# Patient Record
Sex: Female | Born: 1980 | Race: Black or African American | Hispanic: No | Marital: Married | Smoking: Never smoker
Health system: Southern US, Community
[De-identification: ages and names within clinical notes are randomized; demographics above are authoritative.]

## PROBLEM LIST (undated history)

## (undated) DIAGNOSIS — L309 Dermatitis, unspecified: Secondary | ICD-10-CM

## (undated) DIAGNOSIS — J45909 Unspecified asthma, uncomplicated: Secondary | ICD-10-CM

## (undated) HISTORY — DX: Dermatitis, unspecified: L30.9

## (undated) HISTORY — PX: NO PAST SURGERIES: SHX2092

## (undated) HISTORY — DX: Unspecified asthma, uncomplicated: J45.909

---

## 2002-07-29 ENCOUNTER — Emergency Department (HOSPITAL_COMMUNITY): Admission: EM | Admit: 2002-07-29 | Discharge: 2002-07-30 | Payer: Self-pay | Admitting: Unknown Physician Specialty

## 2002-08-01 ENCOUNTER — Emergency Department (HOSPITAL_COMMUNITY): Admission: EM | Admit: 2002-08-01 | Discharge: 2002-08-01 | Payer: Self-pay | Admitting: Emergency Medicine

## 2009-01-06 ENCOUNTER — Other Ambulatory Visit: Admission: RE | Admit: 2009-01-06 | Discharge: 2009-01-06 | Payer: Self-pay | Admitting: Family Medicine

## 2011-01-10 ENCOUNTER — Other Ambulatory Visit (HOSPITAL_COMMUNITY)
Admission: RE | Admit: 2011-01-10 | Discharge: 2011-01-10 | Disposition: A | Discharge status: Home / Self Care | Payer: BC Managed Care – PPO | Source: Ambulatory Visit | Attending: Family Medicine | Admitting: Family Medicine

## 2011-01-10 DIAGNOSIS — Z0000 Encounter for general adult medical examination without abnormal findings: Secondary | ICD-10-CM | POA: Insufficient documentation

## 2014-03-28 ENCOUNTER — Other Ambulatory Visit: Payer: Self-pay | Admitting: Obstetrics & Gynecology

## 2014-03-28 ENCOUNTER — Other Ambulatory Visit (HOSPITAL_COMMUNITY)
Admission: RE | Admit: 2014-03-28 | Discharge: 2014-03-28 | Disposition: A | Discharge status: Home / Self Care | Payer: BC Managed Care – PPO | Source: Ambulatory Visit | Attending: Obstetrics & Gynecology | Admitting: Obstetrics & Gynecology

## 2014-03-28 DIAGNOSIS — Z1151 Encounter for screening for human papillomavirus (HPV): Secondary | ICD-10-CM | POA: Insufficient documentation

## 2014-03-28 DIAGNOSIS — Z01419 Encounter for gynecological examination (general) (routine) without abnormal findings: Secondary | ICD-10-CM | POA: Insufficient documentation

## 2016-09-08 ENCOUNTER — Other Ambulatory Visit: Payer: Self-pay | Admitting: Obstetrics & Gynecology

## 2016-09-08 DIAGNOSIS — N63 Unspecified lump in unspecified breast: Secondary | ICD-10-CM

## 2016-09-13 ENCOUNTER — Ambulatory Visit
Admission: RE | Admit: 2016-09-13 | Discharge: 2016-09-13 | Disposition: A | Discharge status: Home / Self Care | Payer: BC Managed Care – PPO | Source: Ambulatory Visit | Attending: Obstetrics & Gynecology | Admitting: Obstetrics & Gynecology

## 2016-09-13 DIAGNOSIS — N63 Unspecified lump in unspecified breast: Secondary | ICD-10-CM

## 2017-05-15 ENCOUNTER — Other Ambulatory Visit: Payer: Self-pay | Admitting: Obstetrics & Gynecology

## 2017-05-15 DIAGNOSIS — N63 Unspecified lump in unspecified breast: Secondary | ICD-10-CM

## 2017-05-19 ENCOUNTER — Ambulatory Visit: Payer: BC Managed Care – PPO

## 2017-05-19 ENCOUNTER — Ambulatory Visit
Admission: RE | Admit: 2017-05-19 | Discharge: 2017-05-19 | Disposition: A | Discharge status: Home / Self Care | Payer: BC Managed Care – PPO | Source: Ambulatory Visit | Attending: Obstetrics & Gynecology | Admitting: Obstetrics & Gynecology

## 2017-05-19 DIAGNOSIS — N63 Unspecified lump in unspecified breast: Secondary | ICD-10-CM

## 2017-08-21 ENCOUNTER — Other Ambulatory Visit: Payer: Self-pay | Admitting: Obstetrics & Gynecology

## 2017-08-21 DIAGNOSIS — R921 Mammographic calcification found on diagnostic imaging of breast: Secondary | ICD-10-CM

## 2017-09-14 ENCOUNTER — Ambulatory Visit
Admission: RE | Admit: 2017-09-14 | Discharge: 2017-09-14 | Disposition: A | Discharge status: Home / Self Care | Payer: BC Managed Care – PPO | Source: Ambulatory Visit | Attending: Obstetrics & Gynecology | Admitting: Obstetrics & Gynecology

## 2017-09-14 DIAGNOSIS — R921 Mammographic calcification found on diagnostic imaging of breast: Secondary | ICD-10-CM

## 2017-10-23 ENCOUNTER — Ambulatory Visit: Payer: BC Managed Care – PPO | Admitting: Allergy & Immunology

## 2017-10-23 ENCOUNTER — Encounter: Payer: Self-pay | Admitting: Allergy & Immunology

## 2017-10-23 VITALS — BP 114/74 | HR 72 | Ht 64.75 in | Wt 155.4 lb

## 2017-10-23 DIAGNOSIS — J984 Other disorders of lung: Secondary | ICD-10-CM | POA: Diagnosis not present

## 2017-10-23 DIAGNOSIS — T781XXD Other adverse food reactions, not elsewhere classified, subsequent encounter: Secondary | ICD-10-CM | POA: Diagnosis not present

## 2017-10-23 DIAGNOSIS — R062 Wheezing: Secondary | ICD-10-CM | POA: Diagnosis not present

## 2017-10-23 DIAGNOSIS — R0602 Shortness of breath: Secondary | ICD-10-CM

## 2017-10-23 NOTE — Patient Instructions (Addendum)
1. Shortness of breath with wheezing - mildly responsive to bronchodilators - I am uncertain why you are having such low lung volumes, but we will try to see why you are having these issues. - We will start a prednisone taper to see if this can help reverse your symptoms. - Start a prednisone burst: 6 tabs daily x 2 days, 5 tabs daily x 2 days, 4 tabs daily x 2 days, 3 tabs daily x 2 days, 2 tabs daily x 2 days, 1 tab daily x 2 days, then stop. - We will also start you on a combined inhaled steroid and long-acting form of albuterol to see if this help with your lung testing as well: Breo 200/5 one puff once daily (sample provided). - I would like to get a chest X-ray as well to make sure we are not missing anything in your lungs.   2. Adverse food reaction - We will do more targeted testing at the next visit if your lung function is better. - List of available foods provided so that you can determine which foods you would like to test at the next visit. - Stay off of antihistamines for three days before the next appointment.   3. Return in about 2 weeks (around 11/06/2017).    Please inform us of any Emergency Department visits, hospitalizations, or changes in symptoms. Call us before going to the ED for breathing or allergy symptoms since we might be able to fit you in for a sick visit. Feel free to contact us anytime with any questions, problems, or concerns.  It was a pleasure to meet you today! Enjoy the holiday season!  Websites that have reliable patient information: 1. American Academy of Asthma, Allergy, and Immunology: www.aaaai.org 2. Food Allergy Research and Education (FARE): foodallergy.org 3. Mothers of Asthmatics: http://www.asthmacommunitynetwork.org 4. American College of Allergy, Asthma, and Immunology: www.acaai.org

## 2017-10-23 NOTE — Progress Notes (Signed)
NEW PATIENT  Date of Service/Encounter:  10/25/17  Referring provider: Janyth Pupa, DO   Assessment:   Shortness of breath - with evidence of restriction on spirometry  Adverse food reaction - multiple foods  Plan/Recommendations:   Cindy Hansen is a delightful 36yo female presenting with concerns for adverse food reactions. More concerning, however, is her rather terrible lung function today, which prevented [place] from being to do skin testing today. I am uncertain as to the etiology of her symptoms and spirometric findings. She has no findings of hypersensitivity pneumonitis or interstitial lung disease. Pulmonary fibrosis seems unlikely given her age and health history. She is also fairly asymptomatic. Air movement improved with each albuterol nebulizer treatment, which is reassuring. However, this did not change how she felt at all. She has no risk factors, including cigarette smoking or radiation exposure. She is not in a high risk job. I do think that she needs full pulmonary function testing and a high-resolution chest CT, however the patient prefers to hold off for now. This seems reasonable since I will be seeing her in follow up in two weeks.   1. Shortness of breath with wheezing - mildly responsive to bronchodilators - I am uncertain of the etiology of Cindy Hansen's symptoms.  - We will start a prednisone taper to see if this can help reverse your symptoms. - Start a prednisone burst: 6 tabs daily x 2 days, 5 tabs daily x 2 days, 4 tabs daily x 2 days, 3 tabs daily x 2 days, 2 tabs daily x 2 days, 1 tab daily x 2 days, then stop. - We will also start a combined inhaled steroid and long-acting form of albuterol to see if this help with your lung testing as well: Breo 200/5 one puff once daily (sample provided) - I would like to get a chest X-ray as well to make sure we are not missing anything in your lungs.  2. Adverse food reaction - We will do more targeted testing at the next  visit if your lung function is better. - List of available foods provided so that you can determine which foods you would like to test at the next visit. - Stay off of antihistamines for three days before the next appointment.   3. Return in about 2 weeks (around 11/06/2017).  Subjective:   Cindy Hansen is a 36 y.o. female presenting today for evaluation of  Chief Complaint  Patient presents with  . Allergy Testing    Pt presents to have allergy testing, Pt c/o wheezing and chest tightness, rash and sneezing at times. Soy and certain nuts cause rashes, dairy products cause vomitting    Cindy Hansen has a history of the following: Patient Active Problem List   Diagnosis Date Noted  . Restrictive lung disease 10/25/2017  . Adverse food reaction 10/25/2017    History obtained from: chart review and patient.  Cindy Hansen was referred by Janyth Pupa, DO.     Cindy Hansen is a 36 y.o. female presenting with concern of coughing wheezing with food exposures. Her history is rather difficult to follow, as she jumps from one topic to the next. From what I can gather,  Cindy Hansen does have a reaction to perfumes where she will start itching, throat scratching, ear scratching and eye burning. This results in immediate problems with breathing. She has learned to avoid certain environments to manage this. Symptoms resolve once she changes environments. She never needs any kind of medical intervention for these  symptoms, and she returns to baseline quickly thereafter. She does have a similar reaction to pet dander.   Cindy Hansen has noticed certain foods that result in reactions. She has noticed that soy containing foods cause breathing problems, indigestion, and shortness of breath. Symptoms occur within minutes of ingesting the food. She also a problem with trail mix at one point; five minutes after eating it, she had a similar constellation of symptoms including shortness and  indigestion once daily. She treated this with diphenhydramine with improvement in symptoms. She does tolerate peanuts without a problem; she eats these all of the time without a problem. She does have problems with pine nut-containing hummus. Chocolate is another one that causes these reactions. Cow's milk causes nearly immediate vomiting (liquid dairy occurs fairly quickly, but yogurt and cheese result in symptoms over a longer period of time). She does not have an EpiPen. She does not treat every reaction necessarily. She has considered going to see a gastroenterologist but has not seem one at this time.   Cindy Hansen does have a history of asthma and has albuterol to use as needed. She is very active and runs 4-5 times per week. She has never needed prednisone for her breathing and has never been on a daily asthma medication. She is a runner and can sustain long runs without breathing problems. She does have a history of itchy watery eyes, but this has actually improved over the last two years. She has never been allergy tested for environmental allergens.   Otherwise, there is no history of other atopic diseases, including drug allergies, stinging insect allergies, or urticaria. There is no significant infectious history. Vaccinations are up to date.    Past Medical History: Patient Active Problem List   Diagnosis Date Noted  . Restrictive lung disease 10/25/2017  . Adverse food reaction 10/25/2017    Medication List:  Allergies as of 10/23/2017   No Known Allergies     Medication List        Accurate as of 10/23/17 11:59 PM. Always use your most recent med list.          multivitamin tablet Take 1 tablet by mouth daily.       Birth History: non-contributory.   Developmental History: Cindy Hansen has met all milestones on time. She has required no speech therapy, occupational therapy, or physical therapy.   Past Surgical History: No past surgical history on file.   Family  History: No family history on file.   Social History: Cindy Hansen lives at home by herself. She lives in a house that is 36 years old. There is carpeting throughout the home. There is gas heating and central cooling. There are no animals inside or outside of the home. There are dust mite coverings in the bedding. There is no smoking exposure. She works as a Veterinary surgeon) . She did have two gerbils at some point, but otherwise no pets.     Review of Systems: a 14-point review of systems is pertinent for what is mentioned in HPI.  Otherwise, all other systems were negative. Constitutional: negative other than that listed in the HPI Eyes: negative other than that listed in the HPI Ears, nose, mouth, throat, and face: negative other than that listed in the HPI Respiratory: negative other than that listed in the HPI Cardiovascular: negative other than that listed in the HPI Gastrointestinal: negative other than that listed in the HPI Genitourinary: negative other than that listed in  the HPI Integument: negative other than that listed in the HPI Hematologic: negative other than that listed in the HPI Musculoskeletal: negative other than that listed in the HPI Neurological: negative other than that listed in the HPI Allergy/Immunologic: negative other than that listed in the HPI    Objective:   Blood pressure 114/74, pulse 72, height 5' 4.75" (1.645 m), weight 155 lb 6.4 oz (70.5 kg), SpO2 98 %. Body mass index is 26.06 kg/m.   Physical Exam:  General: Alert, interactive, in no acute distress. Pleasant but somewhat guarded.  Eyes: No conjunctival injection bilaterally, no discharge on the right, no discharge on the left and no Horner-Trantas dots present. PERRL bilaterally. EOMI without pain. No photophobia.  Ears: Right TM pearly gray with normal light reflex, Left TM pearly gray with normal light reflex, Right TM intact without perforation and Left TM  intact without perforation.  Nose/Throat: External nose within normal limits and septum midline. Turbinates edematous without discharge. Posterior oropharynx mildly erythematous without cobblestoning in the posterior oropharynx. Tonsils 2+ without exudates.  Tongue without thrush. Neck: Supple without thyromegaly. Trachea midline. Adenopathy: no enlarged lymph nodes appreciated in the anterior cervical, occipital, axillary, epitrochlear, inguinal, or popliteal regions. Lungs: Decreased breath sounds bilaterally without wheezing, rhonchi or rales. There are nearly absent sounds at the bases initially. No increased work of breathing. CV: Normal S1/S2. No murmurs. Capillary refill <2 seconds.  Abdomen: Nondistended, nontender. No guarding or rebound tenderness. Bowel sounds present in all fields and hypoactive  Skin: Warm and dry, without lesions or rashes. Extremities:  No clubbing, cyanosis or edema. Neuro:   Grossly intact. No focal deficits appreciated. Responsive to questions.  Diagnostic studies:   Spirometry: results abnormal (FEV1: 1.00/37%, FVC: 1.30/40%, FEV1/FVC: 77%).    Spirometry consistent with possible restrictive disease. Albuterol nebulizer treatment given in clinic with significant improvement in FEV1 and FVC per ATS criteria. We gave two treatments with improvement (see values below).  Date FVC FEV1 FEV1/FVC  10/23/2017 (1st) 1.3 1.00 0.77  10/23/2017 (2nd) 1.41 1.21 0.86  10/23/2017 (3rd) 1.49 1.27 0.85     Allergy Studies: none (deferred due to lung function)     Salvatore Marvel, MD [facility]

## 2017-10-25 ENCOUNTER — Encounter: Payer: Self-pay | Admitting: Allergy & Immunology

## 2017-10-25 DIAGNOSIS — J984 Other disorders of lung: Secondary | ICD-10-CM | POA: Insufficient documentation

## 2017-10-25 DIAGNOSIS — T781XXA Other adverse food reactions, not elsewhere classified, initial encounter: Secondary | ICD-10-CM | POA: Insufficient documentation

## 2017-11-06 ENCOUNTER — Ambulatory Visit: Payer: BC Managed Care – PPO | Admitting: Allergy & Immunology

## 2017-11-06 ENCOUNTER — Encounter: Payer: Self-pay | Admitting: Allergy & Immunology

## 2017-11-06 VITALS — BP 116/70 | HR 68 | Ht 64.0 in | Wt 155.0 lb

## 2017-11-06 DIAGNOSIS — T781XXD Other adverse food reactions, not elsewhere classified, subsequent encounter: Secondary | ICD-10-CM | POA: Diagnosis not present

## 2017-11-06 DIAGNOSIS — J4540 Moderate persistent asthma, uncomplicated: Secondary | ICD-10-CM

## 2017-11-06 DIAGNOSIS — J3089 Other allergic rhinitis: Secondary | ICD-10-CM

## 2017-11-06 MED ORDER — CETIRIZINE HCL 10 MG PO TABS: 10.0000 mg | ORAL_TABLET | Freq: Every day | ORAL | 5 refills | Status: DC

## 2017-11-06 MED ORDER — FLUTICASONE FUROATE-VILANTEROL 200-25 MCG/INH IN AEPB: 1.0000 | INHALATION_SPRAY | Freq: Every day | RESPIRATORY_TRACT | 3 refills | Status: DC

## 2017-11-06 MED ORDER — ALBUTEROL SULFATE HFA 108 (90 BASE) MCG/ACT IN AERS: 2.0000 | INHALATION_SPRAY | RESPIRATORY_TRACT | 1 refills | Status: DC | PRN

## 2017-11-06 NOTE — Progress Notes (Signed)
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 Moderate persistent asthma - improved spirometry with prednisone burst - Your lung function completely normalized today, confirming the diagnosis of asthma. - We will continue Breo for now, but we may decrease this dose over time.  - Daily controller medication(s): Breo 200/3225mcg one puff once daily - Prior to physical activity: ProAir 2 puffs 10-15 minutes before physical activity. - Rescue medications: ProAir 4 puffs every 4-6 hours as needed - Asthma control goals:  * Full participation in all desired activities (may need albuterol before activity) * Albuterol use two time or less a week on average (not counting use with activity) * Cough interfering with sleep two time or less a month * Oral steroids no more than once a year * No hospitalizations  2. Adverse food reaction - milk - Testing was positive only to milk today, so I would definitely avoid that. - Your other foods are likely intolerances only and not life-threatening. - Testing was negative to peanut, tree nuts, soy, wheat, egg, shellfish mix, fish mix, barley, oat, rye, and sesame as well as chocolate. - There is a the low positive predictive value of food allergy testing and hence the high possibility of false positives. - In contrast, food allergy testing has a high negative predictive value, therefore if testing is negative we can be relatively assured that they are indeed negative.  - We will send in a script for AuviQ (epinephrine). - This is a similar medication as an EpiPen, but a different delivery device.  - They will call you in 1-2 days to confirm your shipping address.    3. Perennial allergic rhinitis - Testing today showed: horse, mouse,  dust mites, cat and dog - Avoidance measures provided. - Start taking: Zyrtec (cetirizine) 10mg  tablet once daily as needed.  - You can use an extra dose of the antihistamine, if needed, for breakthrough symptoms.  - Consider nasal saline rinses 1-2 times daily to remove allergens from the nasal cavities as well as help with mucous clearance as needed.   4. Return in about 3 months (around 02/04/2018).  Subjective:   Cindy Hansen is a 36 y.o. female presenting today for follow up of  Chief Complaint  Patient presents with  . Follow-up    Pt presents for follow up of asthma, Pt started recommended medications and did notice some improvement.    Cindy Hansen has a history of the following: Patient Active Problem List   Diagnosis Date Noted  . Restrictive lung disease 10/25/2017  . Adverse food reaction 10/25/2017    History obtained from: chart review and patient.  Cindy Hansen's Primary Care Provider is Myna Hidalgozan, Jennifer, DO.     Cindy NortonSharonda is a 36 y.o. female presenting for a follow up visit. She was last seen around two weeks ago. At that time, she was very concerned with multiple adverse food reactions versus intolerances. However, since she had a history of requiring albuterol, we did perform a spirometry which looked rather terrible. It was reversible but did not improve completely with the albuterol treatment. Therefore, we put her on a nearly 14 day course of prednisone to see if we could reverse this. We discuss doing full pulmonary function testing as well as a chest X-ray, but she declined  this workup. At also started him on Breo 200/25 one puff once daily. Her reactions consisted of shortness of breath with vague GI symptoms, but her SOB was occurring throughout the entire day anyway even in the absence of food triggers.   Since the last visit, she has done well. She does report some problems sleeping with the prolonged prednisone taper, but overall she feels that  this has helped her symptoms tremendously. [facility] and reports excellent compliance with this. She did recently complete a trip to [place] over the holiday and did well during this trip.   She continues to be interested in food allergy testing and would like to proceed if her lung testing is improved. Otherwise, there have been no changes to her past medical history, surgical history, family history, or social history.    Review of Systems: a 14-point review of systems is pertinent for what is mentioned in HPI.  Otherwise, all other systems were negative. Constitutional: negative other than that listed in the HPI Eyes: negative other than that listed in the HPI Ears, nose, mouth, throat, and face: negative other than that listed in the HPI Respiratory: negative other than that listed in the HPI Cardiovascular: negative other than that listed in the HPI Gastrointestinal: negative other than that listed in the HPI Genitourinary: negative other than that listed in the HPI Integument: negative other than that listed in the HPI Hematologic: negative other than that listed in the HPI Musculoskeletal: negative other than that listed in the HPI Neurological: negative other than that listed in the HPI Allergy/Immunologic: negative other than that listed in the HPI    Objective:   Blood pressure 116/70, pulse 68, height 5\' 4"  (1.626 m), weight 155 lb (70.3 kg), SpO2 99 %. Body mass index is 26.61 kg/m.   Physical Exam: deferred since this was a skin testing appointment only    Diagnostic studies: none  Spirometry: results normal (FEV1: 2.19/83%, FVC: 3.28/104%, FEV1/FVC: 67%).    Spirometry consistent with normal pattern.   Allergy Studies:   Indoor/Outdoor Percutaneous Adult Environmental Panel: positive to [place], Df mite, cat, dog, horse and mouse. Otherwise negative with adequate controls.  Selected Food Panel: positive to Milk (2 x 5) with adequate controls.  Negative to Peanut, Soy, Wheat, Egg, Casein, Shellfish Mix, Fish Mix, Cashew, [place], [address], Chocolate, [place], [place], [place] nut, [place], Oat, Rye and Sesame      Malachi BondsJoel Koryn Charlot, MD FAAAAI [facility]

## 2017-11-06 NOTE — Patient Instructions (Addendum)
1. Moderate persistent asthma - improved spirometry with prednisone burst - Your lung function completely normalized today, confirming the diagnosis of asthma. - We will continue Breo for now, but we may decrease this dose over time.  - Daily controller medication(s): Breo 200/425mcg one puff once daily - Prior to physical activity: ProAir 2 puffs 10-15 minutes before physical activity. - Rescue medications: ProAir 4 puffs every 4-6 hours as needed - Asthma control goals:  * Full participation in all desired activities (may need albuterol before activity) * Albuterol use two time or less a week on average (not counting use with activity) * Cough interfering with sleep two time or less a month * Oral steroids no more than once a year * No hospitalizations  2. Adverse food reaction - milk - Testing was positive only to milk today, so I would definitely avoid that. - Your other foods are likely intolerances only and not life-threatening. - Testing was negative to peanut, tree nuts, soy, wheat, egg, shellfish mix, fish mix, barley, oat, rye, and sesame as well as chocolate. - There is a the low positive predictive value of food allergy testing and hence the high possibility of false positives. - In contrast, food allergy testing has a high negative predictive value, therefore if testing is negative we can be relatively assured that they are indeed negative.  - We will send in a script for AuviQ (epinephrine). - This is a similar medication as an EpiPen, but a different delivery device.  - They will call you in 1-2 days to confirm your shipping address.    3. Perennial allergic rhinitis - Testing today showed: horse, mouse, dust mites, cat and dog - Avoidance measures provided. - Start taking: Zyrtec (cetirizine) 10mg  tablet once daily as needed.  - You can use an extra dose of the antihistamine, if needed, for breakthrough symptoms.  - Consider nasal saline rinses 1-2 times daily to remove  allergens from the nasal cavities as well as help with mucous clearance as needed.   4. Return in about 3 months (around 02/04/2018).    Please inform us of any Emergency Department visits, hospitalizations, or changes in symptoms. Call us before going to the ED for breathing or allergy symptoms since we might be able to fit you in for a sick visit. Feel free to contact us anytime with any questions, problems, or concerns.  It was a pleasure to meet you today! Enjoy the holiday season!  Websites that have reliable patient information: 1. American Academy of Asthma, Allergy, and Immunology: www.aaaai.org 2. Food Allergy Research and Education (FARE): foodallergy.org 3. Mothers of Asthmatics: http://www.asthmacommunitynetwork.org 4. American College of Allergy, Asthma, and Immunology: www.acaai.org     Control of House Dust Mite Allergen    House dust mites play a major role in allergic asthma and rhinitis.  They occur in environments with high humidity wherever human skin, the food for dust mites is found. High levels have been detected in dust obtained from mattresses, pillows, carpets, upholstered furniture, bed covers, clothes and soft toys.  The principal allergen of the house dust mite is found in its feces.  A gram of dust may contain 1,000 mites and 250,000 fecal particles.  Mite antigen is easily measured in the air during house cleaning activities.    1. Encase mattresses, including the box spring, and pillow, in an air tight cover.  Seal the zipper end of the encased mattresses with wide adhesive tape. 2. Wash the bedding in water of  130 degrees Farenheit weekly.  Avoid cotton comforters/quilts and flannel bedding: the most ideal bed covering is the dacron comforter. 3. Remove all upholstered furniture from the bedroom. 4. Remove carpets, carpet padding, rugs, and non-washable window drapes from the bedroom.  Wash drapes weekly or use plastic window coverings. 5. Remove all  non-washable stuffed toys from the bedroom.  Wash stuffed toys weekly. 6. Have the room cleaned frequently with a vacuum cleaner and a damp dust-mop.  The patient should not be in a room which is being cleaned and should wait 1 hour after cleaning before going into the room. 7. Close and seal all heating outlets in the bedroom.  Otherwise, the room will become filled with dust-laden air.  An electric heater can be used to heat the room. 8. Reduce indoor humidity to less than 50%.  Do not use a humidifier.  Control of Dog or Cat Allergen  Avoidance is the best way to manage a dog or cat allergy. If you have a dog or cat and are allergic to dog or cats, consider removing the dog or cat from the home. If you have a dog or cat but don't want to find it a new home, or if your family wants a pet even though someone in the household is allergic, here are some strategies that may help keep symptoms at bay:  1. Keep the pet out of your bedroom and restrict it to only a few rooms. Be advised that keeping the dog or cat in only one room will not limit the allergens to that room. 2. Don't pet, hug or kiss the dog or cat; if you do, wash your hands with soap and water. 3. High-efficiency particulate air (HEPA) cleaners run continuously in a bedroom or living room can reduce allergen levels over time. 4. Regular use of a high-efficiency vacuum cleaner or a central vacuum can reduce allergen levels. 5. Giving your dog or cat a bath at least once a week can reduce airborne allergen.

## 2017-11-20 ENCOUNTER — Other Ambulatory Visit: Payer: Self-pay

## 2017-11-20 MED ORDER — EPINEPHRINE 0.3 MG/0.3ML IJ SOAJ: 0.3000 mg | Freq: Once | INTRAMUSCULAR | 1 refills | Status: AC

## 2018-02-06 ENCOUNTER — Other Ambulatory Visit: Payer: Self-pay | Admitting: Allergy & Immunology

## 2018-02-06 NOTE — Telephone Encounter (Signed)
Courtesy refill  

## 2018-02-15 ENCOUNTER — Encounter: Payer: Self-pay | Admitting: Allergy & Immunology

## 2018-02-15 ENCOUNTER — Ambulatory Visit: Payer: BC Managed Care – PPO | Admitting: Allergy & Immunology

## 2018-02-15 VITALS — BP 100/70 | HR 60 | Resp 16

## 2018-02-15 DIAGNOSIS — T7800XD Anaphylactic reaction due to unspecified food, subsequent encounter: Secondary | ICD-10-CM

## 2018-02-15 DIAGNOSIS — J4540 Moderate persistent asthma, uncomplicated: Secondary | ICD-10-CM

## 2018-02-15 DIAGNOSIS — J3089 Other allergic rhinitis: Secondary | ICD-10-CM

## 2018-02-15 NOTE — Patient Instructions (Addendum)
1. Moderate persistent asthma  - Lung function was mostly normal today. - Try taking your Breo at night to see if that helps prevent that throat/chest discomfort.  - Give us a call next week to give us an update. - We could change to Symbicort in the future if needed.  - Daily controller medication(s): Breo 200/7525mcg one puff once daily - Prior to physical activity: ProAir 2 puffs 10-15 minutes before physical activity. - Rescue medications: ProAir 4 puffs every 4-6 hours as needed - Asthma control goals:  * Full participation in all desired activities (may need albuterol before activity) * Albuterol use two time or less a week on average (not counting use with activity) * Cough interfering with sleep two time or less a month * Oral steroids no more than once a year * No hospitalizations  2. Adverse food reaction (cow's milk)  - Continue to avoid cow's milk.  Audry Riles- AuviQ is up to date.   3. Perennial allergic rhinitis (horse, mouse, dust mites, cat and dog) - Continue with Zyrtec (cetirizine) 10mg  tablet once daily as needed.  - You can use an extra dose of the antihistamine, if needed, for breakthrough symptoms.   4. Return in about 6 months (around 08/17/2018).  Please inform us of any Emergency Department visits, hospitalizations, or changes in symptoms. Call us before going to the ED for breathing or allergy symptoms since we might be able to fit you in for a sick visit. Feel free to contact us anytime with any questions, problems, or concerns.  It was a pleasure to see you again today!  Websites that have reliable patient information: 1. American Academy of Asthma, Allergy, and Immunology: www.aaaai.org 2. Food Allergy Research and Education (FARE): foodallergy.org 3. Mothers of Asthmatics: http://www.asthmacommunitynetwork.org 4. American College of Allergy, Asthma, and Immunology: www.acaai.org

## 2018-02-15 NOTE — Progress Notes (Signed)
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  seems to have done well since her last visit.  Her Breo improved both her cough and her exercise tolerance.  She also is using markedly less albuterol. However, she is having some chest tightness and throat irritation, which she does attribute to her controller medication.  He does premedicate with albuterol prior to physical activity.  Hoarseness can be a common side effect of these dry powder inhalers, therefore this might be what is manifesting during her episodes of physical activity. We will try to change the timing of her Breo administration to see if this can improve her symptoms. However, if she continues to have problems we will try changing to Symbicort which unfortunately has to be given more frequently and/or adding on montelukast. Environmental allergies and food allergies are under good control. There is no indication for allergen immunotherapy at this time.   1. Moderate persistent asthma  - Lung function was mostly normal today. - Try taking your Breo at night to see if that helps prevent that throat/chest discomfort.  - Give [place] a call next week to give [place] an update. - We could change to Symbicort in the future if needed.  - Daily controller medication(s): Breo 200/56mcg one puff once daily - Prior to physical activity: ProAir 2 puffs 10-15 minutes before physical activity. - Rescue medications: ProAir 4 puffs every 4-6 hours as needed - Asthma control goals:  * Full participation in all desired activities (may need albuterol before activity) * Albuterol use two time or less a week on average (not  counting use with activity) * Cough interfering with sleep two time or less a month * Oral steroids no more than once a year * No hospitalizations  2. Adverse food reaction (cow's milk)  - Continue to avoid cow's milk.  Cindy Hansen is up to date.   3. Perennial allergic rhinitis (horse, mouse, dust mites, cat and dog) - Continue with Zyrtec (cetirizine) 10mg  tablet once daily as needed.  - You can use an extra dose of the antihistamine, if needed, for breakthrough symptoms.   4. Return in about 6 months (around 08/17/2018).  Subjective:   Cindy Hansen is a 37 y.o. female presenting today for follow up of  Chief Complaint  Patient presents with  . Asthma    doing good  . Allergies    doing good    Cindy Hansen has a history of the following: Patient Active Problem List   Diagnosis Date Noted  . Restrictive lung disease 10/25/2017  . Adverse food reaction 10/25/2017    History obtained from: chart review and patient.  Cindy Hansen's Primary Care Provider is Cindy Hidalgo, DO.     Cindy Hansen is a 37 y.o. female presenting for a follow up visit. She was last seen in December 2018.  At that time, her lung function had completely normalized on Breo 200/25 1 puff once daily.  She did have testing that was positive to milk, but otherwise negative to the most common food allergens.  I recommended avoidance of cow's milk and all forms.  She also had environmental allergy testing that was positive to horse, mouse,  dust mites, cat, and dog.  We started her on cetirizine 10 mg daily.  Since the last visit, she has mostly done well.  Asthma/Respiratory Symptom History: She remains on her Cindy Hansen but she feels that it is causing some burning when she uses it. 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 During the run, she begins to have some burning in her chest and throat. She denies throat closure but it is more of a "really  strong burning". She has been using albuterol prior to her run and then did Breo in the evening. Prior to the onset of this, she was taking her Cindy Hansen prior to going to work. She will run at 6pm at night so it has had time to "work into her system". She has not had her Breo today.   Allergic Rhinitis Symptom History: She remains on her cetirizine daily. This seems to control her allergic rhinitis without a problem. She has no intervening sinus infections. She does not have pets in her home at all. 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 But with the cetirizine on board, she is better able to tolerate exposure to these students.   Food Allergy Symptom History: She remains on almond milk as her substitute. She avoids milk in all forms. She can eat small amounts of baked goods without a problem. She was able to finally get the AuviQ. Evidently it was noted her in chart that she had Medicaid, which is what held up her prescription.   Otherwise, there have been no changes to her past medical history, surgical history, family history, or social history.    Review of Systems: a 14-point review of systems is pertinent for what is mentioned in HPI.  Otherwise, all other systems were negative. Constitutional: negative other than that listed in the HPI Eyes: negative other than that listed in the HPI Ears, nose, mouth, throat, and face: negative other than that listed in the HPI Respiratory: negative other than that listed in the HPI Cardiovascular: negative other than that listed in the HPI Gastrointestinal: negative other than that listed in the HPI Genitourinary: negative other than that listed in the HPI Integument: negative other than that listed in the HPI Hematologic: negative other than that listed in the HPI Musculoskeletal: negative other than that listed in the HPI Neurological: negative other than that listed in the HPI Allergy/Immunologic: negative other than that listed  in the HPI    Objective:   Blood pressure 100/70, pulse 60, resp. rate 16. There is no height or weight on file to calculate BMI.   Physical Exam:  General: Alert, interactive, in no acute distress. Pleasant female. Well dressed.  Eyes: No conjunctival injection bilaterally, no discharge on the right, no discharge on the left and no Horner-Trantas dots present. PERRL bilaterally. EOMI without pain. No photophobia.  Ears: Right TM pearly gray with normal light reflex, Left TM pearly gray with normal light reflex, Right TM intact without perforation and Left TM intact without perforation.  Nose/Throat: External nose within normal limits and septum midline. Turbinates edematous without discharge. Posterior oropharynx mildly erythematous without cobblestoning in the posterior oropharynx. Tonsils 2+ without exudates.  Tongue without thrush. Lungs: Clear to auscultation without wheezing, rhonchi or rales. No increased work of breathing. CV: Normal S1/S2. No murmurs. Capillary refill <2 seconds.  Skin: Warm and dry, without lesions or rashes. Neuro:   Grossly intact. No focal deficits appreciated.  Responsive to questions.  Diagnostic studies:   Spirometry: results abnormal (FEV1: 1.79/65%, FVC: 2.55/77%, FEV1/FVC: 70%).    Spirometry consistent with possible restrictive disease.   Allergy Studies: none      Malachi BondsJoel Marlean Mortell, MD [facility]

## 2018-02-28 ENCOUNTER — Other Ambulatory Visit: Payer: Self-pay | Admitting: Allergy & Immunology

## 2018-03-04 ENCOUNTER — Other Ambulatory Visit: Payer: Self-pay | Admitting: Allergy & Immunology

## 2018-03-05 ENCOUNTER — Other Ambulatory Visit: Payer: Self-pay | Admitting: Allergy & Immunology

## 2018-07-06 ENCOUNTER — Other Ambulatory Visit: Payer: Self-pay | Admitting: Allergy & Immunology

## 2018-08-12 ENCOUNTER — Other Ambulatory Visit: Payer: Self-pay | Admitting: Allergy & Immunology

## 2018-08-15 ENCOUNTER — Other Ambulatory Visit: Payer: Self-pay | Admitting: Obstetrics & Gynecology

## 2018-08-15 DIAGNOSIS — N6489 Other specified disorders of breast: Secondary | ICD-10-CM

## 2018-09-05 ENCOUNTER — Other Ambulatory Visit: Payer: Self-pay | Admitting: Allergy & Immunology

## 2018-09-17 ENCOUNTER — Ambulatory Visit
Admission: RE | Admit: 2018-09-17 | Discharge: 2018-09-17 | Disposition: A | Discharge status: Home / Self Care | Payer: BC Managed Care – PPO | Source: Ambulatory Visit | Attending: Obstetrics & Gynecology | Admitting: Obstetrics & Gynecology

## 2018-09-17 DIAGNOSIS — N6489 Other specified disorders of breast: Secondary | ICD-10-CM

## 2018-09-29 ENCOUNTER — Other Ambulatory Visit: Payer: Self-pay | Admitting: Allergy & Immunology

## 2018-10-01 ENCOUNTER — Other Ambulatory Visit: Payer: Self-pay | Admitting: Allergy & Immunology

## 2018-10-01 NOTE — Telephone Encounter (Signed)
Courtesy refill  

## 2018-10-25 ENCOUNTER — Other Ambulatory Visit: Payer: Self-pay | Admitting: Allergy & Immunology

## 2018-11-16 ENCOUNTER — Other Ambulatory Visit: Payer: Self-pay | Admitting: Allergy & Immunology

## 2018-11-16 MED ORDER — FLUTICASONE FUROATE-VILANTEROL 200-25 MCG/INH IN AEPB: 1.0000 | INHALATION_SPRAY | Freq: Every day | RESPIRATORY_TRACT | 0 refills | Status: DC

## 2018-11-16 MED ORDER — ALBUTEROL SULFATE HFA 108 (90 BASE) MCG/ACT IN AERS: 2.0000 | INHALATION_SPRAY | RESPIRATORY_TRACT | 1 refills | Status: DC | PRN

## 2018-11-16 NOTE — Telephone Encounter (Signed)
Script sent into pharmacy for 30 day supply only. Patient must keep appointment for additional refills.

## 2018-11-16 NOTE — Telephone Encounter (Signed)
Patient needs a refill on medications sent to [facility] on [address] Patient has upcoming appt 01/28 - was last seen 02/2018 The pharmacy has her script from 10/2017 and wont refill anymore  Can a courtesy refill be sent in since it has not been a year since the patient was last seen??

## 2018-12-04 ENCOUNTER — Ambulatory Visit: Payer: BC Managed Care – PPO | Admitting: Allergy & Immunology

## 2018-12-16 ENCOUNTER — Other Ambulatory Visit: Payer: Self-pay | Admitting: Allergy & Immunology

## 2018-12-25 ENCOUNTER — Ambulatory Visit: Payer: BC Managed Care – PPO | Admitting: Allergy & Immunology

## 2018-12-25 ENCOUNTER — Encounter: Payer: Self-pay | Admitting: Allergy & Immunology

## 2018-12-25 VITALS — BP 110/68 | HR 65 | Resp 16 | Ht 65.0 in | Wt 195.0 lb

## 2018-12-25 DIAGNOSIS — T7800XD Anaphylactic reaction due to unspecified food, subsequent encounter: Secondary | ICD-10-CM

## 2018-12-25 DIAGNOSIS — J3089 Other allergic rhinitis: Secondary | ICD-10-CM | POA: Diagnosis not present

## 2018-12-25 DIAGNOSIS — J4540 Moderate persistent asthma, uncomplicated: Secondary | ICD-10-CM

## 2018-12-25 MED ORDER — MONTELUKAST SODIUM 10 MG PO TABS: 10.0000 mg | ORAL_TABLET | Freq: Every day | ORAL | 6 refills | Status: DC

## 2018-12-25 MED ORDER — FLUTICASONE-UMECLIDIN-VILANT 100-62.5-25 MCG/INH IN AEPB: 1.0000 | INHALATION_SPRAY | Freq: Every day | RESPIRATORY_TRACT | 6 refills | Status: AC

## 2018-12-25 NOTE — Patient Instructions (Addendum)
1. Moderate persistent asthma  - Lung function was fairly normal today. - It seems that the Virgel Bouquet is working well, but I am going to give you a sample of Trelegy instead (this contains Breo as well as another medication that can help keeps your lungs open).  - We are also adding on a pill to take at night to help with your asthma.  - Daily controller medication(s): Singulair 10mg  daily and Trelegy 100/62.5/25 one puff once daily - Prior to physical activity: ProAir 2 puffs 10-15 minutes before physical activity. - Rescue medications: ProAir 4 puffs every 4-6 hours as needed - Asthma control goals:  * Full participation in all desired activities (may need albuterol before activity) * Albuterol use two time or less a week on average (not counting use with activity) * Cough interfering with sleep two time or less a month * Oral steroids no more than once a year * No hospitalizations  2. Adverse food reaction (cow's milk)  - Continue to avoid cow's milk.  Audry Riles is up to date.   3. Perennial allergic rhinitis (horse, mouse, dust mites, cat and dog) - Continue with Zyrtec (cetirizine) 10mg  tablet once daily as needed.  - You can use an extra dose of the antihistamine, if needed, for breakthrough symptoms. - If you get a dog, keep him out of the bedroom. - Hardwood floors are better for controlling animal dander.   4. Return in about 3 months (around 03/25/2019).   Please inform us of any Emergency Department visits, hospitalizations, or changes in symptoms. Call us before going to the ED for breathing or allergy symptoms since we might be able to fit you in for a sick visit. Feel free to contact us anytime with any questions, problems, or concerns.  It was a pleasure to see you again today!  Websites that have reliable patient information: 1. American Academy of Asthma, Allergy, and Immunology: www.aaaai.org 2. Food Allergy Research and Education (FARE): foodallergy.org 3. Mothers of  Asthmatics: http://www.asthmacommunitynetwork.org 4. American College of Allergy, Asthma, and Immunology: MissingWeapons.ca   Make sure you are registered to vote! If you have moved or changed any of your contact information, you will need to get this updated before voting!    Voter ID laws are POSSIBLY going into effect for the General Election in November 2020! Be prepared! Check out [employer]dk for more details.

## 2018-12-25 NOTE — Progress Notes (Signed)
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 Moderate persistent asthma  - Lung function was fairly normal today. - It seems that the Virgel Bouquet is working well, but I am going to give you a sample of Trelegy instead (this contains Breo as well as another medication that can help keeps your lungs open).  - We are also adding on a pill to take at night to help with your asthma.  - Daily controller medication(s): Singulair 10mg  daily and Trelegy 100/62.5/25 one puff once daily - Prior to physical activity: ProAir 2 puffs 10-15 minutes before physical activity. - Rescue medications: ProAir 4 puffs every 4-6 hours as needed - Asthma control goals:  * Full participation in all desired activities (may need albuterol before activity) * Albuterol use two time or less a week on average (not counting use with activity) * Cough interfering with sleep two time or less a month * Oral steroids no more than once a year * No hospitalizations  2. Adverse food reaction (cow's milk)  - Continue to avoid cow's milk.  Audry Riles is up to date.   3. Perennial allergic rhinitis (horse, mouse, dust mites, cat and dog) - Continue with Zyrtec (cetirizine) 10mg  tablet once daily as needed.  - You can use an extra dose of the antihistamine, if needed, for breakthrough symptoms. - If you get a dog, keep him out of the bedroom. - Hardwood floors are better for controlling animal dander.   4. Return in about 3 months (around 03/25/2019).  Subjective:   Cindy Hansen is a 38 y.o. female presenting today for follow up of  Chief Complaint  Patient presents with  . Asthma    Cindy Hansen has a history of the following: Patient Active Problem List   Diagnosis Date Noted  . Restrictive lung disease 10/25/2017  . Adverse food reaction 10/25/2017    History obtained from: chart review and patient.  Cindy Hansen is a 38 y.o. female presenting for a follow up visit.  She was last seen in April 2019.  At that time, her lung testing was normal.  I recommended that she take her Breo at night to see if that helped with the throat and chest discomfort.  For her allergic rhinitis, we continued Zyrtec.  We recommended continued avoidance of cows milk.  Asthma/Respiratory Symptom History: She remains on the Breo one puff once daily. She can tell when she is having problems because she will have gaps when she does not have the medication. This leads to problems with increased symptoms of her asthma. She has not been on it consistently aside from one week. She has not needed prednisone at all since that last visit. She does need her rescue inhaler fairly rarely. Lately it has been more frequent since she has not had her Breo, but maybe 1-2 times per week. She has not missed work for her asthma and has not changed her life for her asthma. She does note that she is unable to run. She does even premedicate prior to albuterol.  Allergic Rhinitis Symptom History: She is using her cetirizine dialy but she is not using a nose spray. Typically her issues  are more related to sneezing/itchy watery eyes. She does have a flare with floral scents. She also notes that she has had a terrible sinus infection. She reports that she had nasal pressure and a sinus headache with mucous production. She ended up using a decongestant to help with this. She is better now. Cat is a big trigger for her. Her last testing was done in December 2018 at which time she had testing that was positive to cat, dog, dust mite, horse, and mouse.    Otherwise, there is no history of other atopic diseases, including food  allergies, drug allergies, stinging insect allergies, eczema, urticaria or contact dermatitis. There is no significant infectious history. Vaccinations are up to date.   Otherwise, there have been no changes to her past medical history, surgical history, family history, or social history.    Review of Systems  Constitutional: Negative.  Negative for fever, malaise/fatigue and weight loss.  HENT: Negative.  Negative for congestion, ear discharge and ear pain.   Eyes: Negative for pain, discharge and redness.  Respiratory: Positive for cough and shortness of breath. Negative for sputum production and wheezing.   Cardiovascular: Negative.  Negative for chest pain and palpitations.  Gastrointestinal: Negative for abdominal pain and heartburn.  Skin: Negative.  Negative for itching and rash.  Neurological: Negative for dizziness and headaches.  Endo/Heme/Allergies: Negative for environmental allergies. Does not bruise/bleed easily.       Objective:   Blood pressure 110/68, pulse 65, resp. rate 16, height 5\' 5"  (1.651 m), weight 195 lb (88.5 kg), SpO2 98 %. Body mass index is 32.45 kg/m.   Physical Exam:  Physical Exam  Constitutional: She appears well-developed.  HENT:  Head: Normocephalic and atraumatic.  Right Ear: Tympanic membrane, external ear and ear canal normal.  Left Ear: Tympanic membrane and ear canal normal.  Nose: No mucosal edema, rhinorrhea, nasal deformity or septal deviation. No epistaxis. Right sinus exhibits no maxillary sinus tenderness and no frontal sinus tenderness. Left sinus exhibits no maxillary sinus tenderness and no frontal sinus tenderness.  Mouth/Throat: Uvula is midline and oropharynx is clear and moist. Mucous membranes are not pale and not dry.  Eyes: Pupils are equal, round, and reactive to light. Conjunctivae and EOM are normal. Right eye exhibits no chemosis and no discharge. Left eye exhibits no chemosis and no discharge. Right conjunctiva is not  injected. Left conjunctiva is not injected.  Cardiovascular: Normal rate, regular rhythm and normal heart sounds.  Respiratory: Effort normal and breath sounds normal. No accessory muscle usage. No tachypnea. No respiratory distress. She has no wheezes. She has no rhonchi. She has no rales. She exhibits no tenderness.  Mildly decreased air movement at the bases.  Lymphadenopathy:    She has no cervical adenopathy.  Neurological: She is alert.  Skin: No abrasion, no petechiae and no rash noted. Rash is not papular, not vesicular and not urticarial. No erythema. No pallor.  Psychiatric: She has a normal mood and affect.     Diagnostic studies:    Spirometry: results abnormal (FEV1: 1.86/70%, FVC: 2.86/89%, FEV1/FVC: 65%).    Spirometry consistent with mild obstructive disease.   Allergy Studies: none       Malachi Bonds, MD  [facility]

## 2018-12-26 ENCOUNTER — Encounter: Payer: Self-pay | Admitting: Allergy & Immunology

## 2019-02-10 ENCOUNTER — Other Ambulatory Visit: Payer: Self-pay | Admitting: Allergy & Immunology

## 2019-04-14 ENCOUNTER — Other Ambulatory Visit: Payer: Self-pay | Admitting: Allergy & Immunology

## 2019-05-16 ENCOUNTER — Other Ambulatory Visit: Payer: Self-pay | Admitting: Allergy & Immunology

## 2019-06-13 ENCOUNTER — Other Ambulatory Visit: Payer: Self-pay | Admitting: Allergy & Immunology

## 2019-08-30 ENCOUNTER — Other Ambulatory Visit: Payer: Self-pay | Admitting: Allergy & Immunology

## 2019-11-12 ENCOUNTER — Other Ambulatory Visit: Payer: Self-pay

## 2019-11-12 ENCOUNTER — Encounter: Payer: Self-pay | Admitting: Allergy & Immunology

## 2019-11-12 ENCOUNTER — Ambulatory Visit: Payer: BC Managed Care – PPO | Admitting: Allergy & Immunology

## 2019-11-12 VITALS — BP 112/60 | HR 76 | Temp 97.9°F | Resp 18 | Ht 65.0 in | Wt 205.0 lb

## 2019-11-12 DIAGNOSIS — J3089 Other allergic rhinitis: Secondary | ICD-10-CM

## 2019-11-12 DIAGNOSIS — T7800XD Anaphylactic reaction due to unspecified food, subsequent encounter: Secondary | ICD-10-CM

## 2019-11-12 DIAGNOSIS — J4540 Moderate persistent asthma, uncomplicated: Secondary | ICD-10-CM

## 2019-11-12 MED ORDER — ALBUTEROL SULFATE HFA 108 (90 BASE) MCG/ACT IN AERS: 2.0000 | INHALATION_SPRAY | RESPIRATORY_TRACT | 1 refills | Status: DC | PRN

## 2019-11-12 MED ORDER — BREO ELLIPTA 200-25 MCG/INH IN AEPB: 1.0000 | INHALATION_SPRAY | Freq: Every day | RESPIRATORY_TRACT | 5 refills | Status: DC

## 2019-11-12 NOTE — Patient Instructions (Addendum)
1. Moderate persistent asthma  - Lung function was slightly lower today. - We are going to keep you on the Trelegy.  - Daily controller medication(s): Singulair 10mg  daily and Breo 200/73mcg one puff once daily - Prior to physical activity: ProAir 2 puffs 10-15 minutes before physical activity. - Rescue medications: ProAir 4 puffs every 4-6 hours as needed - Asthma control goals:  * Full participation in all desired activities (may need albuterol before activity) * Albuterol use two time or less a week on average (not counting use with activity) * Cough interfering with sleep two time or less a month * Oral steroids no more than once a year * No hospitalizations  2. Adverse food reaction (cow's milk)  - Continue to avoid cow's milk.  32m is up to date.  - Milk testing collected today. - We are ordering labs, so please allow 1-2 weeks for the results to come back. - With the newly implemented Cures Act, the labs might be visible to you at the same time that they become visible to me. - However, I will not address the results until all of the results come  back, so please be patient.  - In the meantime, continue avoiding your triggering food(s) in your After Visit Summary, including avoidance measures (if applicable), until you hear from me about the results.    3. Perennial allergic rhinitis (horse, mouse, dust mites, cat and dog) - Continue with Zyrtec (cetirizine) 10mg  tablet once daily as needed.   4. Return in about 6 months (around 05/11/2020). This can be an in-person, a virtual Webex or a telephone follow up visit.   Please inform of any Emergency Department visits, hospitalizations, or changes in symptoms. Call 07/12/2020 before going to the ED for breathing or allergy symptoms since we might be able to fit you in for a sick visit. Feel free to contact us anytime with any questions, problems, or concerns.  It was a pleasure to see you again today!  Websites that have reliable  patient information: 1. American Academy of Asthma, Allergy, and Immunology: www.aaaai.org 2. Food Allergy Research and Education (FARE): foodallergy.org 3. Mothers of Asthmatics: http://www.asthmacommunitynetwork.org 4. American College of Allergy, Asthma, and Immunology: www.acaai.org  "Like" [place] on Facebook and Instagram for our latest updates!        Make sure you are registered to vote! If you have moved or changed any of your contact information, you will need to get this updated before voting!  In some cases, you MAY be able to register to vote online: [place]

## 2019-11-12 NOTE — Progress Notes (Signed)
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 Moderate persistent asthma  - Lung function was slightly lower today. - We are going to keep you on the Trelegy.  - Daily controller medication(s): Singulair 10mg  daily and Breo 200/40mcg one puff once daily - Prior to physical activity: ProAir 2 puffs 10-15 minutes before physical activity. - Rescue medications: ProAir 4 puffs every 4-6 hours as needed - Asthma control goals:  * Full participation in all desired activities (may need albuterol before activity) * Albuterol use two time or less a week on average (not counting use with activity) * Cough interfering with sleep two time or less a month * Oral steroids no more than once a year * No hospitalizations  2. Adverse food reaction (cow's milk)  - Continue to avoid cow's milk.  32m is up to date.  - Milk testing collected today. - We are ordering labs, so please allow 1-2 weeks for the results to come back. - With the newly implemented Cures Act, the labs might be visible to you at the same time that they become visible to me. - However, I will not address the results until all of the results come  back, so please be patient.  - In the meantime, continue avoiding your triggering food(s) in your After Visit Summary, including avoidance measures (if applicable), until you hear from me about the results.    3. Perennial allergic rhinitis (horse, mouse, dust mites, cat and dog) - Continue with Zyrtec (cetirizine) 10mg  tablet once daily as needed.   4. Return in about 6 months (around 05/11/2020). This can be an in-person, a  virtual Webex or a telephone follow up visit.   Subjective:   Cindy Hansen is a 39 y.o. female presenting today for follow up of  Chief Complaint  Patient presents with  . Asthma    Cindy Hansen has a history of the following: Patient Active Problem List   Diagnosis Date Noted  . Restrictive lung disease 10/25/2017  . Adverse food reaction 10/25/2017    History obtained from: chart review and patient.  Cindy Hansen is a 39 y.o. female presenting for a follow up visit.  She was last seen in February 2020.  At that time, her lung function was normal.  We changed her from [facility] to Trelegy 1 puff once daily.  We also continued Singulair 10 mg daily.  We recommended continued avoidance of cows milk.  For her rhinitis, we continued Zyrtec as needed.  At the time, she was having large gaps between refills of her inhalers it was difficult to figure out what was working and what was not working.  Since the last visit, she has mostly done well.   Asthma/Respiratory Symptom History: She did try the Trelegy but she was experiencing throat burning. She felt that the March 2020 was working better. She went back to the [place]. We did try adding on the Singulair and she experienced some symptoms from this. She has been out of the [facility] for a while. She has not required any prednisone and has not been to the ED for any symptoms at all. She reports that she is sleeping well at night without PM  awakenings. ACT is 15, indicating subpar asthma control. This is likely secondary to her getting out of her [employer] over the last month. She thinks that she will be fine once she is back on her [employer] on a regular basis.   Allergic Rhinitis Symptom History: She is no longer on the cetirizine. She was fine for a period of time. She ended up working in a different building which exposed her to less allergen. She has not required any antibiotics in quite some time. She tells me that she is conscious of her allergies to  animals. She is still planning on getting a dog when they move. They are going to "re-entertain the idea" of getting a dog.  Food Allergy Symptom History: She continues to avoid milk. Her husband is very good at noticing any milk in substances. She is open to repeat testing today. She thinks that her Wynona Luna is up to date.  Eczema Symptom History: She does have some eczema within her scalp.   Otherwise, there have been no changes to her past medical history, surgical history, family history, or social history.    Review of Systems  Constitutional: Negative.  Negative for fever, malaise/fatigue and weight loss.  HENT: Negative.  Negative for congestion, ear discharge and ear pain.   Eyes: Negative for pain, discharge and redness.  Respiratory: Positive for cough and shortness of breath. Negative for sputum production and wheezing.   Cardiovascular: Negative.  Negative for chest pain and palpitations.  Gastrointestinal: Negative for abdominal pain, constipation, diarrhea, heartburn, nausea and vomiting.  Skin: Negative.  Negative for itching and rash.  Neurological: Negative for dizziness and headaches.  Endo/Heme/Allergies: Negative for environmental allergies. Does not bruise/bleed easily.       Objective:   Blood pressure 112/60, pulse 76, temperature 97.9 F (36.6 C), temperature source Temporal, resp. rate 18, height 5\' 5"  (1.651 m), weight 205 lb (93 kg), SpO2 99 %. Body mass index is 34.11 kg/m.   Physical Exam:  Physical Exam  Constitutional: She appears well-developed.  Talkative, pleasant female.   HENT:  Head: Normocephalic and atraumatic.  Right Ear: Tympanic membrane, external ear and ear canal normal.  Left Ear: Tympanic membrane, external ear and ear canal normal.  Nose: Mucosal edema and rhinorrhea present. No nasal deformity or septal deviation. No epistaxis. Right sinus exhibits no maxillary sinus tenderness and no frontal sinus tenderness. Left sinus exhibits  no maxillary sinus tenderness and no frontal sinus tenderness.  Mouth/Throat: Uvula is midline and oropharynx is clear and moist. Mucous membranes are not pale and not dry.  No polyps appreciated.   Eyes: Pupils are equal, round, and reactive to light. Conjunctivae and EOM are normal. Right eye exhibits no chemosis and no discharge. Left eye exhibits no chemosis and no discharge. Right conjunctiva is not injected. Left conjunctiva is not injected.  Cardiovascular: Normal rate, regular rhythm and normal heart sounds.  Respiratory: Effort normal and breath sounds normal. No accessory muscle usage. No tachypnea. No respiratory distress. She has no wheezes. She has no rhonchi. She has no rales. She exhibits no tenderness.  Moving air well in all lung fields. No increased work of breathing noted.   Lymphadenopathy:    She has no cervical adenopathy.  Neurological: She is alert.  Skin: No abrasion, no petechiae and no rash noted. Rash is not papular, not vesicular and not urticarial. No erythema. No pallor.  No urticarial or eczematous lesions noted.   Psychiatric: She has a normal mood and affect.  Diagnostic studies:    Spirometry: results normal (FEV1: 1.88/70%, FVC: 2.72/84%, FEV1/FVC: 69%).    Spirometry consistent with normal pattern.   Allergy Studies: none       Malachi Bonds, MD  [facility]

## 2019-11-13 ENCOUNTER — Encounter: Payer: Self-pay | Admitting: Allergy & Immunology

## 2019-11-14 LAB — MILK COMPONENT PANEL
F076-IgE Alpha Lactalbumin: <0.1 kU/L
F077-IgE Beta Lactoglobulin: <0.1 kU/L
F078-IgE Casein: <0.1 kU/L

## 2020-03-07 DIAGNOSIS — R7303 Prediabetes: Secondary | ICD-10-CM

## 2020-03-07 HISTORY — DX: Prediabetes: R73.03

## 2020-03-31 ENCOUNTER — Other Ambulatory Visit: Payer: Self-pay | Admitting: Obstetrics & Gynecology

## 2020-03-31 DIAGNOSIS — N979 Female infertility, unspecified: Secondary | ICD-10-CM

## 2020-04-02 ENCOUNTER — Ambulatory Visit
Admission: RE | Admit: 2020-04-02 | Discharge: 2020-04-02 | Disposition: A | Discharge status: Home / Self Care | Payer: BC Managed Care – PPO | Source: Ambulatory Visit | Attending: Obstetrics & Gynecology | Admitting: Obstetrics & Gynecology

## 2020-04-02 DIAGNOSIS — N979 Female infertility, unspecified: Secondary | ICD-10-CM

## 2020-04-29 ENCOUNTER — Encounter: Payer: Self-pay | Admitting: Allergy & Immunology

## 2020-04-30 NOTE — Telephone Encounter (Signed)
Her lung testing was little bit lower last time, so I would like to get a breathing test.  We prefer to see our persistent asthmatic patients every 6 months to make sure we stay ahead of any problems.  Malachi Bonds, MD [facility]

## 2020-05-12 ENCOUNTER — Ambulatory Visit: Payer: BC Managed Care – PPO | Admitting: Allergy & Immunology

## 2020-05-19 ENCOUNTER — Other Ambulatory Visit: Payer: Self-pay

## 2020-05-19 ENCOUNTER — Ambulatory Visit: Payer: BC Managed Care – PPO | Admitting: Allergy & Immunology

## 2020-05-19 ENCOUNTER — Encounter: Payer: Self-pay | Admitting: Allergy & Immunology

## 2020-05-19 VITALS — BP 124/78 | HR 78 | Temp 97.8°F | Ht 65.0 in | Wt 213.0 lb

## 2020-05-19 DIAGNOSIS — J4540 Moderate persistent asthma, uncomplicated: Secondary | ICD-10-CM | POA: Diagnosis not present

## 2020-05-19 DIAGNOSIS — J3089 Other allergic rhinitis: Secondary | ICD-10-CM

## 2020-05-19 DIAGNOSIS — T7800XD Anaphylactic reaction due to unspecified food, subsequent encounter: Secondary | ICD-10-CM | POA: Diagnosis not present

## 2020-05-19 MED ORDER — CETIRIZINE HCL 10 MG PO TABS: 10.0000 mg | ORAL_TABLET | Freq: Every day | ORAL | 5 refills | Status: DC

## 2020-05-19 NOTE — Patient Instructions (Addendum)
1. Moderate persistent asthma  - Lung function looked excellent today. - Daily controller medication(s): Singulair 10mg  daily and Breo 200/37mcg one puff once daily - Prior to physical activity: ProAir 2 puffs 10-15 minutes before physical activity. - Rescue medications: ProAir 4 puffs every 4-6 hours as needed - Asthma control goals:  * Full participation in all desired activities (may need albuterol before activity) * Albuterol use two time or less a week on average (not counting use with activity) * Cough interfering with sleep two time or less a month * Oral steroids no more than once a year * No hospitalizations  2. Adverse food reaction (cow's milk)  - Continue to avoid cow's milk since it causes vomiting.  - 32m is up to date.   3. Perennial allergic rhinitis (horse, mouse, dust mites, cat and dog) - Continue with Zyrtec (cetirizine) 10mg  tablet once daily as needed.   4. Return in about 6 months (around 11/19/2020). This can be an in-person, a virtual Webex or a telephone follow up visit.   Please inform of any Emergency Department visits, hospitalizations, or changes in symptoms. Call 11/21/2020 before going to the ED for breathing or allergy symptoms since we might be able to fit you in for a sick visit. Feel free to contact us anytime with any questions, problems, or concerns.  It was a pleasure to see you again today!  Websites that have reliable patient information: 1. American Academy of Asthma, Allergy, and Immunology: www.aaaai.org 2. Food Allergy Research and Education (FARE): foodallergy.org 3. Mothers of Asthmatics: http://www.asthmacommunitynetwork.org 4. American College of Allergy, Asthma, and Immunology: www.acaai.org   COVID-19 Vaccine Information can be found at: [place] For questions related to vaccine distribution or appointments, please email [email or web address] or call [phone].      "Like" [email or web address] on Facebook and Instagram for our latest updates!        Make sure you are registered to vote! If you have moved or changed any of your contact information, you will need to get this updated before voting!  In some cases, you MAY be able to register to vote online: [phone]

## 2020-05-19 NOTE — Progress Notes (Signed)
FOLLOW UP  Date of Service/Encounter:  05/19/20   Assessment:   Moderate persistent asthma, uncomplicated   Adverse reaction to Trelegy (throat burning)  Perennial allergic rhinitis (horse, mouse, dust mites, cat and dog)  Anaphylactic shock due to food (cow's milk)  Plan/Recommendations:   1. Moderate persistent asthma  - Lung function looked excellent today. - Daily controller medication(s): Singulair 10mg  daily and Breo 200/78mcg one puff once daily - Prior to physical activity: ProAir 2 puffs 10-15 minutes before physical activity. - Rescue medications: ProAir 4 puffs every 4-6 hours as needed - Asthma control goals:  * Full participation in all desired activities (may need albuterol before activity) * Albuterol use two time or less a week on average (not counting use with activity) * Cough interfering with sleep two time or less a month * Oral steroids no more than once a year * No hospitalizations  2. Adverse food reaction (cow's milk)  - Continue to avoid cow's milk since it causes vomiting.  - 32m is up to date.   3. Perennial allergic rhinitis (horse, mouse, dust mites, cat and dog) - Continue with Zyrtec (cetirizine) 10mg  tablet once daily as needed.   4. Return in about 6 months (around 11/19/2020). This can be an in-person, a virtual Webex or a telephone follow up visit.  Subjective:   Cindy Hansen is a 39 y.o. female presenting today for follow up of  Chief Complaint  Patient presents with  . Asthma    yearly followup  . Medication Refill    Cindy Hansen has a history of the following: Patient Active Problem List   Diagnosis Date Noted  . Restrictive lung disease 10/25/2017  . Adverse food reaction 10/25/2017    History obtained from: chart review and patient.  Cindy Hansen is a 39 y.o. female presenting for a follow up visit.  She was last seen in January 2021.  At that time, her lung function looked slightly lower.  We  stopped her Trelegy because she was experiencing throat irritation.  We continued Singulair and restarted the Breo 200/25 mcg 1 puff once daily.  For history of cows milk allergy, we did obtain lab work which was negative.  However, she preferred to avoid it anyway because she continued to vomit with exposure to milk.  For her rhinitis, she continue with Zyrtec as needed.  Since last visit, she has done well.  Asthma/Respiratory Symptom History: She remains on the Breo 1 puff once daily.  She feels absolutely perfect on this.  She has not been using her rescue inhaler.  Allergic Rhinitis Symptom History: She remains on the Zyrtec as needed.  She does not use any nasal sprays aside from the occasional nasal saline rinse.   Food Allergy Symptom History: She continues to avoid milk.  This seems to be a very dose-dependent response, but it does not take very much for her to have the vomiting.  Otherwise, there have been no changes to her past medical history, surgical history, family history, or social history.    Review of Systems  Constitutional: Negative.  Negative for chills, fever, malaise/fatigue and weight loss.  HENT: Negative.  Negative for congestion, ear discharge and ear pain.   Eyes: Negative for pain, discharge and redness.  Respiratory: Negative for cough, sputum production, shortness of breath and wheezing.   Cardiovascular: Negative.  Negative for chest pain and palpitations.  Gastrointestinal: Negative for abdominal pain, constipation, diarrhea, heartburn, nausea and vomiting.  Skin: Negative.  Negative for itching and rash.  Neurological: Negative for dizziness and headaches.  Endo/Heme/Allergies: Negative for environmental allergies. Does not bruise/bleed easily.       Objective:   Blood pressure 124/78, pulse 78, temperature 97.8 F (36.6 C), height 5\' 5"  (1.651 m), weight 213 lb (96.6 kg), SpO2 98 %. Body mass index is 35.45 kg/m.   Physical Exam:  Physical  Exam Constitutional:      Appearance: She is well-developed.  HENT:     Head: Normocephalic and atraumatic.     Right Ear: Tympanic membrane, ear canal and external ear normal.     Left Ear: Tympanic membrane and ear canal normal.     Nose: No nasal deformity, septal deviation, mucosal edema or rhinorrhea.     Right Sinus: No maxillary sinus tenderness or frontal sinus tenderness.     Left Sinus: No maxillary sinus tenderness or frontal sinus tenderness.     Mouth/Throat:     Mouth: Mucous membranes are not pale and not dry.     Pharynx: Uvula midline.  Eyes:     General:        Right eye: No discharge.        Left eye: No discharge.     Conjunctiva/sclera: Conjunctivae normal.     Right eye: Right conjunctiva is not injected. No chemosis.    Left eye: Left conjunctiva is not injected. No chemosis.    Pupils: Pupils are equal, round, and reactive to light.  Cardiovascular:     Rate and Rhythm: Normal rate and regular rhythm.     Heart sounds: Normal heart sounds.  Pulmonary:     Effort: Pulmonary effort is normal. No tachypnea, accessory muscle usage or respiratory distress.     Breath sounds: Normal breath sounds. No wheezing, rhonchi or rales.  Chest:     Chest wall: No tenderness.  Lymphadenopathy:     Cervical: No cervical adenopathy.  Skin:    Coloration: Skin is not pale.     Findings: No abrasion, erythema, petechiae or rash. Rash is not papular, urticarial or vesicular.  Neurological:     Mental Status: She is alert.      Diagnostic studies:    Spirometry: results normal (FEV1: 2.10/79%, FVC: 3.04/95%, FEV1/FVC: 69%).    Spirometry consistent with normal pattern.   Allergy Studies: none      11-22-1987, MD  [facility]

## 2020-05-20 ENCOUNTER — Encounter: Payer: Self-pay | Admitting: Allergy & Immunology

## 2020-05-27 ENCOUNTER — Other Ambulatory Visit: Payer: Self-pay | Admitting: Allergy & Immunology

## 2020-06-08 LAB — OB RESULTS CONSOLE HIV ANTIBODY (ROUTINE TESTING): HIV: NONREACTIVE

## 2020-06-08 LAB — OB RESULTS CONSOLE RUBELLA ANTIBODY, IGM: Rubella: IMMUNE

## 2020-06-08 LAB — OB RESULTS CONSOLE HEPATITIS B SURFACE ANTIGEN: Hepatitis B Surface Ag: NEGATIVE

## 2020-06-25 ENCOUNTER — Other Ambulatory Visit: Payer: Self-pay

## 2020-06-25 ENCOUNTER — Encounter: Payer: BC Managed Care – PPO | Attending: Obstetrics & Gynecology | Admitting: Dietician

## 2020-06-25 DIAGNOSIS — R7303 Prediabetes: Secondary | ICD-10-CM | POA: Insufficient documentation

## 2020-06-26 ENCOUNTER — Encounter: Payer: Self-pay | Admitting: Dietician

## 2020-06-26 NOTE — Patient Instructions (Addendum)
Plan: Be sure your prenatal vitamin has adequate vitamin D.  Discuss your vitamin D status with your MD. Take a vitamin B-12 supplement (sublingual or dissolvable when you are no longer on your prenatal vitamin if you are not eating animal products).  Stay active as allowed by your OB. Continue/resume meal prepping. Consider overall quality of your diet. Include a protein with each meal and snack.    Beans on a salad  Nuts or nut or seed butter  Fish  Eggs if you tolerate  Plant based protein options such as a black bean burgers Maintain adequate weight gain throughout your pregnancy  Resources: -  Nourish: The Definitive Plant-Based Nutrition Guide for [employer] & Recipes for Bringing Health, Joy, & Connection to Your Dinner Table Paperback - September 24, 2019 by Roxy Cedar M.D. M.P.H.  (Author), Karlyne Greenspan R.D. (Author), Leafy Kindle M.D. M.P.H. (Foreword)  -  The Plant-Based Baby and Toddler: Your Complete Feeding Guide for the First 3 Years Paperback - Mar 24, 2020 by Carles Collet MA RDN  (Author), Elmer Sow MS RDN  Environmental education officer)  - Becoming Vegan: The Complete Reference to Plant-Based Nutrition (Comprehensive Edition) Paperback - June 21, 2013 by Karlyne Greenspan  (Author), Hattie Perch (Author)   - Riley Nearing Podcast Season 3  Any Snackables episodes  #8 Karlyne Greenspan, RD (coauthor of Nourish and Chartered loss adjuster of Becoming Vegan)  #9 Dr. Roxy Cedar (coauthor of Nourish)  #20 Carmelina Dane, RD and Elmer Sow, RD (authors of [employer] and Toddler)  #28 and 29 Dr. Floy Sabina (gut health)  Bonus Jacqlyn Krauss Replay [facility] mayor and his experience with diabetes and plant based eating) - powerful story

## 2020-06-26 NOTE — Progress Notes (Signed)
Medical Nutrition Therapy:  Appt start time: 1140 end time:  1230.   Assessment:  Primary concerns today: Patient is here today alone.  She was diagnosed with prediabetes 03/30/20 with A1C of 5.8% and currently is [redacted] weeks pregnant. She states that she wishes to be proactive. Other history includes vitamin D deficiency.  Patient lives with her husband.  They share shopping and cooking.  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  Preferred Learning Style:   No preference indicated   Learning Readiness:   Ready  Change in progress   MEDICATIONS: prenatal vitamin   DIETARY INTAKE:  She eats more of a plant based diet which includes fish. Avoided foods include Dairy (vomiting, itchy tongue), soy (gas, bloating), beef, pork, rare chicken.    24-hr recall:  B ( AM): 1 1/2 cups plain shredded wheat, 1 cup oat milk, 1/2 cup smoothie (Kale, 1/2 banana, ginger, 3/4 cup OJ, pineapple or pineapple juice).  Snk ( AM): sips on smoothie  L ( PM): salad or leftover vegan hot dog on bun Snk ( PM): rest of smoothie D ( PM): shrimp, pasta, vegetables OR Green Chef or Hello Fresh meal Snk ( PM):  Beverages: water, hot ginger and lemon tea with a very small amouht of honey  Usual physical activity: She used to run half marathons but had not exercised as much since the start of the covid-19 pandemic but is now running, walking and doing cross training for 1 hour 4-5 days per week.    Progress Towards Goal(s):  In progress.   Nutritional Diagnosis:  NB-1.1 Food and nutrition-related knowledge deficit As related to balance of carbohydrates, protein, and fat.  As evidenced by diet hx and patient report.    Intervention:  Nutrition education related to prediabetes, nutrition during pregnancy and benefit of exercise as allowed by her OB.  References discussed.  Discussed importance of proper weight gain during pregnancy as well as proper carbohydrate intake and distribution of  carbohydrates throughout the day.  Discussed vegetarian protein sources to include in addition to the fish. Provided references for vegan nutrition to expand meal ideas in patient with dairy allergy who eats seafood, little chicken and no meat.  Plan: Plan: Be sure your prenatal vitamin has adequate vitamin D.  Discuss your vitamin D status with your MD. Take a vitamin B-12 supplement (sublingual or dissolvable when you are no longer on your prenatal vitamin if you are not eating animal products).  Stay active as allowed by your OB. Continue/resume meal prepping. Consider overall quality of your diet. Include a protein with each meal and snack.    Beans on a salad  Nuts or nut or seed butter  Fish  Eggs if you tolerate  Plant based protein options such as a black bean burgers Maintain adequate weight gain throughout your pregnancy  Resources: -  Nourish: The Definitive Plant-Based Nutrition Guide for [employer] & Recipes for Bringing Health, Joy, & Connection to Your Dinner Table Paperback - September 24, 2019 by Roxy Cedar M.D. M.P.H.  (Author), Karlyne Greenspan R.D. (Author), Leafy Kindle M.D. M.P.H. (Foreword)  -  The Plant-Based Baby and Toddler: Your Complete Feeding Guide for the First 3 Years Paperback - Mar 24, 2020 by Carles Collet MA RDN  (Author), Elmer Sow MS RDN  Environmental education officer)  - Becoming Vegan: The Complete Reference to Plant-Based Nutrition (Comprehensive Edition) Paperback - June 21, 2013 by Karlyne Greenspan  (Author), Hattie Perch Environmental education officer)   -  Plant Strong Podcast Season 3  Any Snackables episodes  #8 Karlyne Greenspan, RD (coauthor of Nourish and Chartered loss adjuster of Becoming Vegan)  #9 Dr. Roxy Cedar (coauthor of Nourish)  #20 Carmelina Dane, RD and Elmer Sow, RD (authors of [employer] and Toddler)  #28 and 29 Dr. Floy Sabina (gut health)  Bonus Jacqlyn Krauss Replay [facility] mayor and his experience with diabetes and plant based eating) - powerful  story  Teaching Method Utilized:  Visual Auditory Hands on  Handouts given during visit include:  Diabetes and Pregnancy (covered in light of prediabetes)  Barriers to learning/adherence to lifestyle change: none  Demonstrated degree of understanding via:  Teach Back   Monitoring/Evaluation:  Dietary intake, exercise, and body weight prn.

## 2020-07-02 LAB — OB RESULTS CONSOLE GC/CHLAMYDIA
Chlamydia: NEGATIVE
Gonorrhea: NEGATIVE

## 2020-07-15 ENCOUNTER — Telehealth: Payer: Self-pay | Admitting: Allergy & Immunology

## 2020-07-15 IMAGING — MG DIGITAL DIAGNOSTIC BILATERAL MAMMOGRAM WITH TOMO AND CAD
8 series · 8 of 24 positions shown · non-contrast
Comparison: Previous exam(s).

CLINICAL DATA: Short-term interval follow-up of a probable benign
asymmetry in the right breast. Asymmetry in the right breast was
initially seen on diagnostic study dated 09/13/2016.

EXAM:
DIGITAL DIAGNOSTIC BILATERAL MAMMOGRAM WITH CAD AND TOMO

[L CC synth-2D]
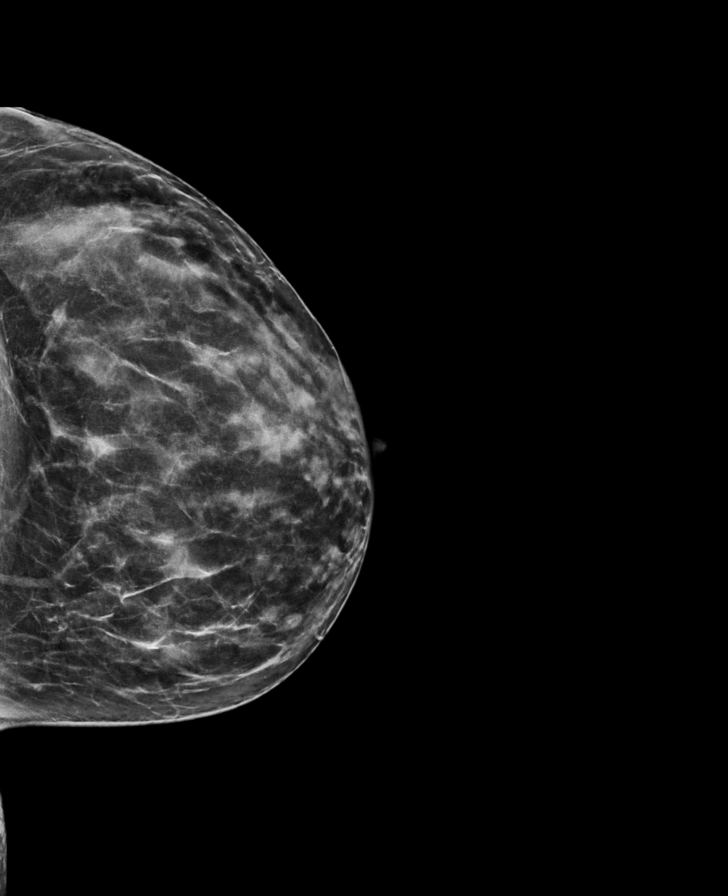

[R MLO synth-2D]
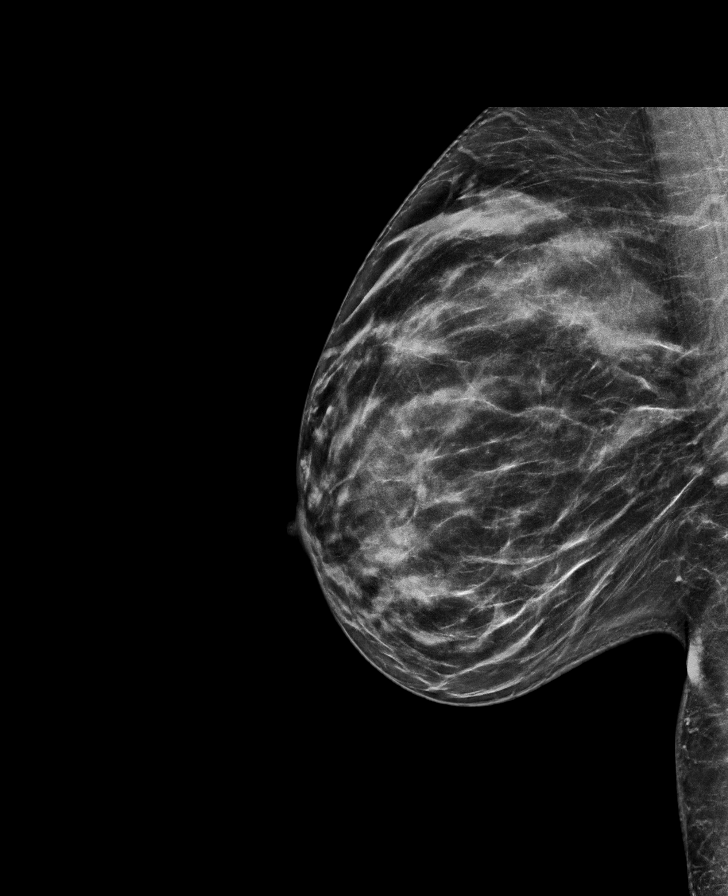

[R CC synth-2D]
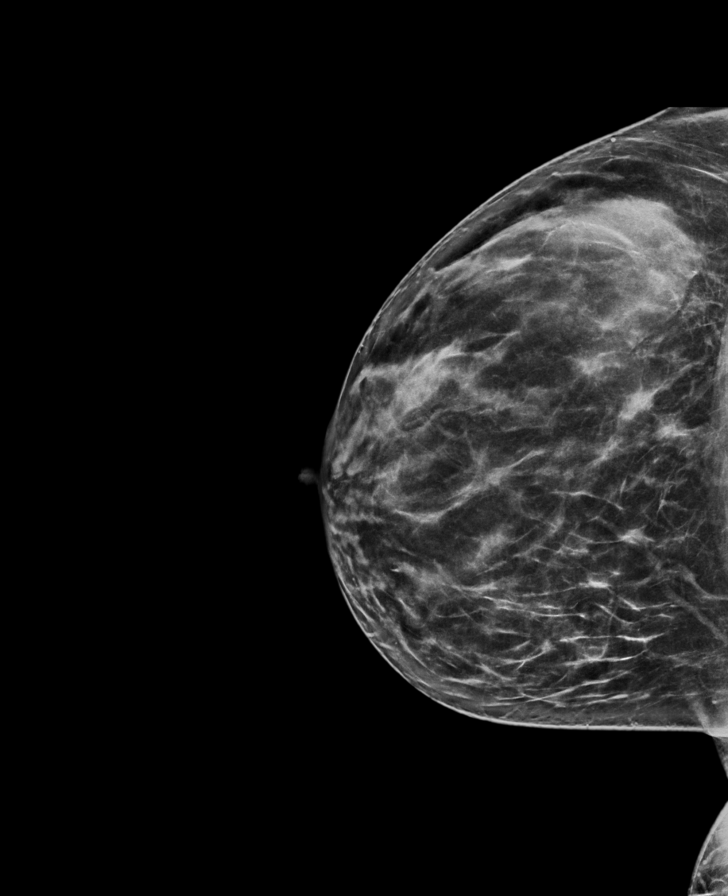

[L MLO synth-2D]
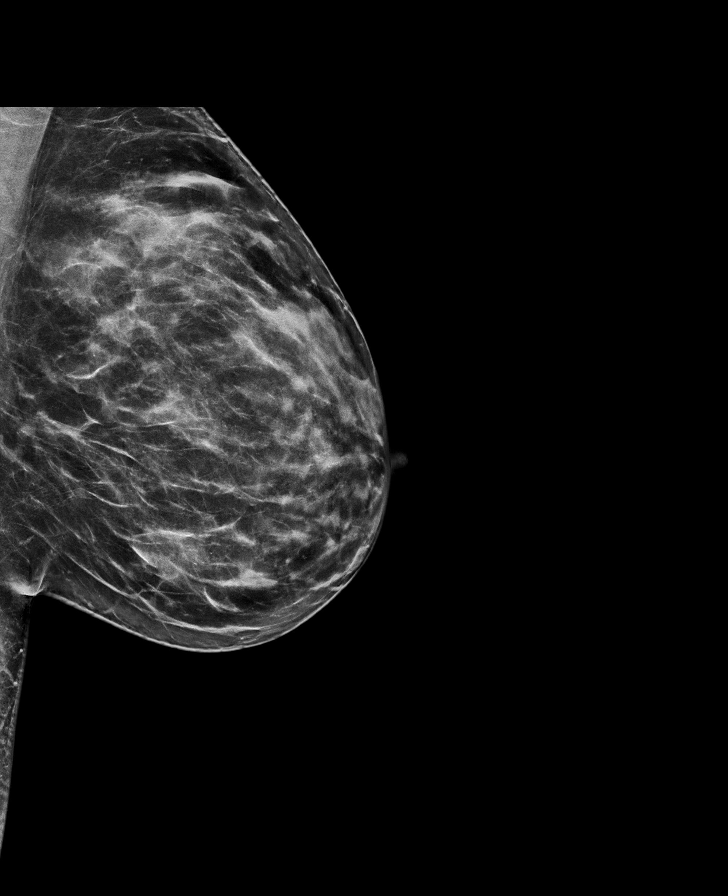

[R MLO tomo · tomo slice 39/76.0]
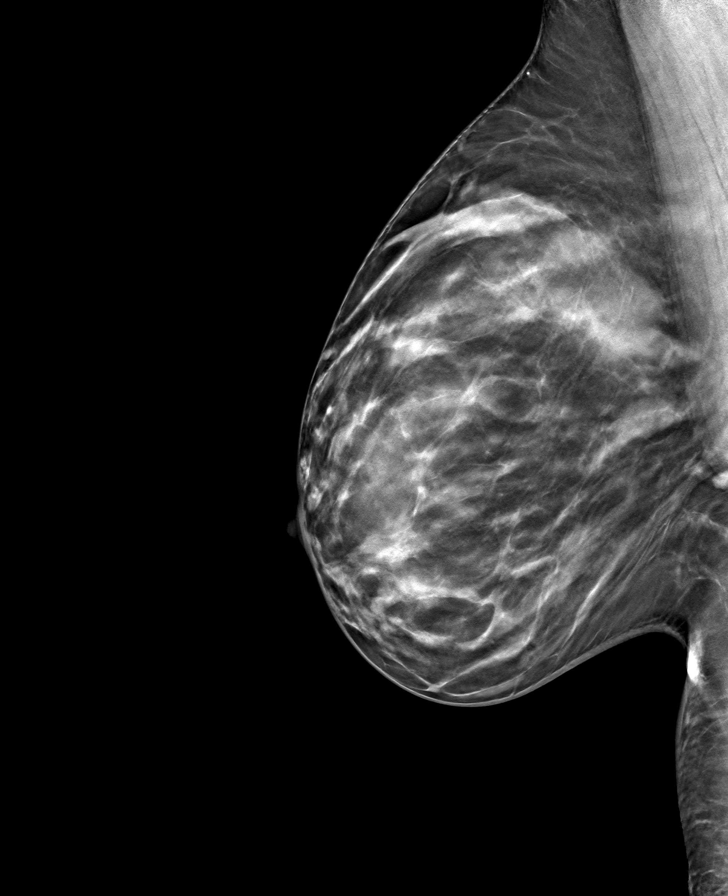

[R CC tomo · tomo slice 38/75.0]
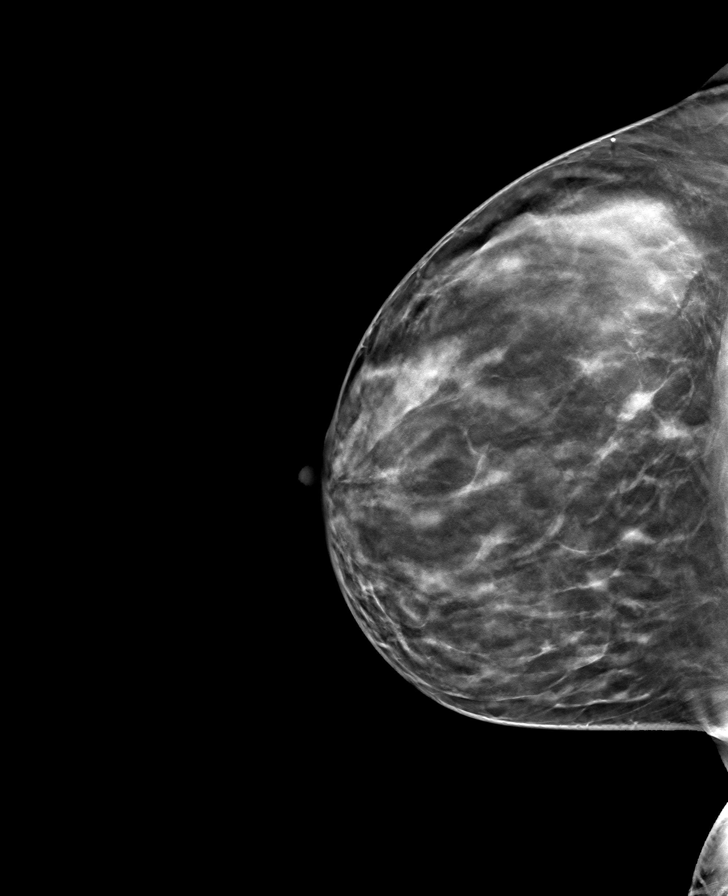

[L CC tomo · tomo slice 37/72.0]
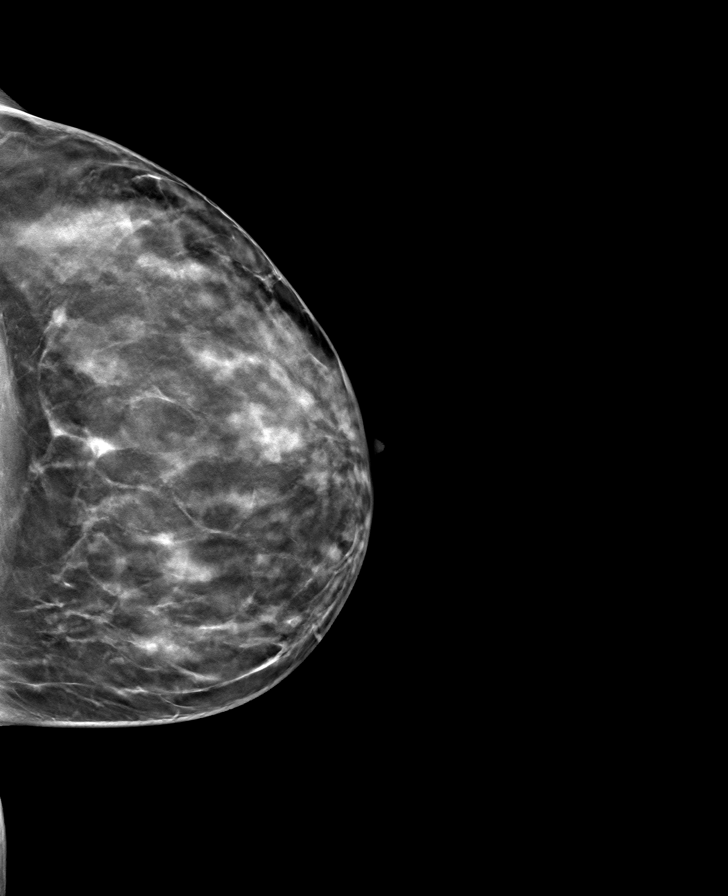

[L MLO tomo · tomo slice 39/78.0]
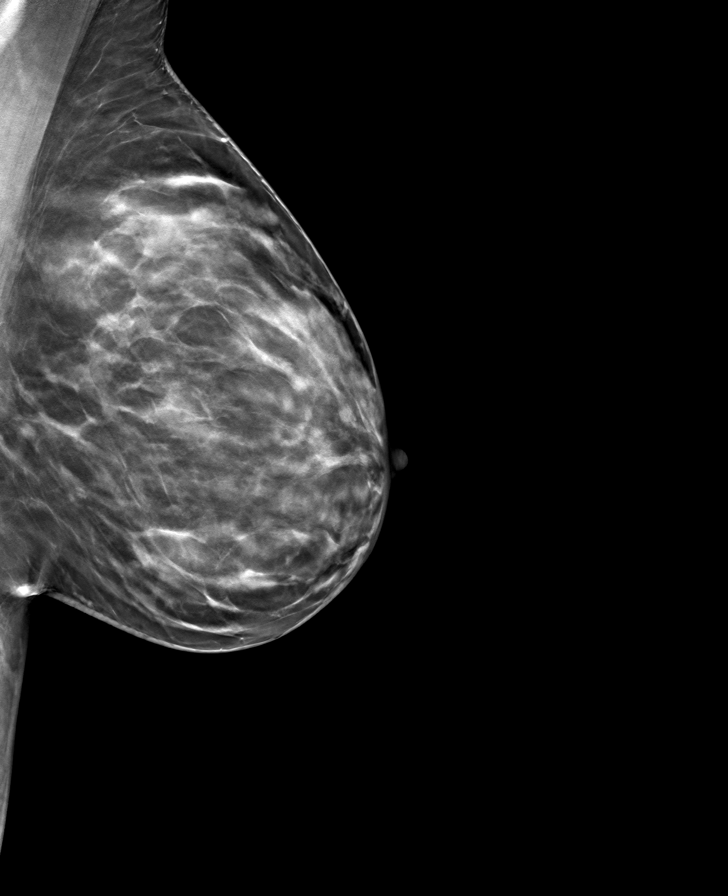

[8 of 24 positions shown; findings below may reference images not displayed]

ACR Breast Density Category c: The breast tissue is heterogeneously
dense, which may obscure small masses.
FINDINGS: No suspicious mass, malignant type microcalcifications or distortion
detected in either breast. Subtle asymmetry in the upper-outer
quadrant of the right breast is stable.

Mammographic images were processed with CAD.
IMPRESSION: No evidence of malignancy in either breast.

RECOMMENDATION:
If the clinical exam remains benign/stable screening mammography can
be deferred until the age of 40.

I have discussed the findings and recommendations with the patient.
Results were also provided in writing at the conclusion of the
visit. If applicable, a reminder letter will be sent to the patient
regarding the next appointment.

BI-RADS CATEGORY  1: Negative.

## 2020-07-15 NOTE — Telephone Encounter (Signed)
Patient called to let Dr. Dellis Anes know she is pregnant. Her OB told her to contact Dr. Dellis Anes so he can monitor her during her pregnancy. She is on Breo and a rescue inhaler. Her OB said these are fine.

## 2020-07-16 NOTE — Telephone Encounter (Signed)
Patient scheduled for 10/14 @545  with in [place].

## 2020-07-16 NOTE — Telephone Encounter (Signed)
We will see her sooner than January.  We will call her and schedule an appointment in October?  Malachi Bonds, MD [facility]

## 2020-08-20 ENCOUNTER — Other Ambulatory Visit: Payer: Self-pay

## 2020-08-20 ENCOUNTER — Encounter: Payer: Self-pay | Admitting: Allergy & Immunology

## 2020-08-20 ENCOUNTER — Ambulatory Visit: Payer: Self-pay | Admitting: Allergy & Immunology

## 2020-08-20 ENCOUNTER — Ambulatory Visit: Payer: BC Managed Care – PPO | Admitting: Allergy & Immunology

## 2020-08-20 VITALS — BP 110/62 | HR 96 | Temp 98.4°F | Resp 16

## 2020-08-20 DIAGNOSIS — T7800XD Anaphylactic reaction due to unspecified food, subsequent encounter: Secondary | ICD-10-CM | POA: Diagnosis not present

## 2020-08-20 DIAGNOSIS — J45909 Unspecified asthma, uncomplicated: Secondary | ICD-10-CM

## 2020-08-20 DIAGNOSIS — J3089 Other allergic rhinitis: Secondary | ICD-10-CM | POA: Diagnosis not present

## 2020-08-20 DIAGNOSIS — O99512 Diseases of the respiratory system complicating pregnancy, second trimester: Secondary | ICD-10-CM | POA: Diagnosis not present

## 2020-08-20 DIAGNOSIS — J4540 Moderate persistent asthma, uncomplicated: Secondary | ICD-10-CM

## 2020-08-20 MED ORDER — MONTELUKAST SODIUM 10 MG PO TABS: 10.0000 mg | ORAL_TABLET | Freq: Every day | ORAL | 5 refills | Status: DC

## 2020-08-20 MED ORDER — EPINEPHRINE 0.3 MG/0.3ML IJ SOAJ: 0.3000 mg | Freq: Once | INTRAMUSCULAR | 2 refills | Status: DC

## 2020-08-20 NOTE — Progress Notes (Signed)
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 Moderate persistent asthma  - Lung function deferred today. - Stop the Breo temporarily and use Trelegy one puff once daily (contains Breo + another medication to help keep your lung open).  - Use albuterol as needed BEFORE physical activity to help you get through.  - Natural course of asthma with pregnancy discussed. - Daily controller medication(s): Singulair 10mg  daily and Trelegy one puff once daily - Prior to physical activity: ProAir 2 puffs 10-15 minutes before physical activity. - Rescue medications: ProAir 4 puffs every 4-6 hours as needed - Asthma control goals:  * Full participation in all desired activities (may need albuterol before activity) * Albuterol use two time or less a week on average (not counting use with activity) * Cough interfering with sleep two time or less a month * Oral steroids no more than once a year * No hospitalizations  2. Adverse food reaction (cow's milk)  - Continue to avoid cow's milk since it causes vomiting.  - is up to date.   3. Perennial allergic rhinitis (horse, mouse, dust mites, cat and dog) - Continue with Zyrtec (cetirizine) 10mg  tablet once daily as needed.   4. Return in about 6 weeks (around 10/01/2020).   Subjective:   Cindy Hansen is a 39 y.o. female presenting today for follow up of  Chief Complaint  Patient presents with  . Asthma    Cindy Hansen has a history of the following: Patient Active Problem List   Diagnosis Date Noted  . Restrictive lung disease 10/25/2017  . Adverse food reaction 10/25/2017    History obtained from: chart  review and patient.  Cindy Hansen is a 39 y.o. female presenting for a follow up visit.  She was last seen in July 2021.  At that time, her lung function looked excellent.  We continue with Singulair as well as Breo 200/25 mcg 1 puff once daily.  For her history of cows milk allergy, we recommended continued avoidance.  Her epinephrine autoinjector was up-to-date.  For her allergic rhinitis, would continue with Zyrtec 10 mg daily as needed.  She is now 5 months pregnant and her asthma has been under poor control. Mom knows the gender, but Dad does not know.   Asthma/Respiratory Symptom History: She remains on the Breo one puff once daily. Her asthma has not been under good control for around 2-3 weeks. She has had worsening SOB with any kind of physical activity. She has noticeably short of breath with increased use of her rescue inhaler. She was previously on Trelegy, but she felt that it was "too much" for her. She takes her Breo in the morning first thing. She has not noticed that the protection weans at all. SOB occurs during any time of the day.  Allergic Rhinitis Symptom History: She remains on Zyrtec 10 mg once daily. Her last skin testing was done in December 2018. She was reactive to molds, dust mite, cat, dog, horse, and mouse.  Food Allergy Symptom History: She continues to avoid milk due to a history of vomiting with it. She does have an up-to-date epinephrine autoinjector. This seems to be a dose dependent response.   Otherwise,  there have been no changes to her past medical history, surgical history, family history, or social history.    Review of Systems  Constitutional: Negative.  Negative for fever, malaise/fatigue and weight loss.  HENT: Negative.  Negative for congestion, ear discharge, ear pain and sore throat.   Eyes: Negative for pain, discharge and redness.  Respiratory: Negative for cough, sputum production, shortness of breath and wheezing.   Cardiovascular: Negative.   Negative for chest pain and palpitations.  Gastrointestinal: Negative for abdominal pain, constipation, diarrhea, heartburn, nausea and vomiting.  Skin: Negative.  Negative for itching and rash.  Neurological: Negative for dizziness and headaches.  Endo/Heme/Allergies: Negative for environmental allergies. Does not bruise/bleed easily.       Objective:   Blood pressure 110/62, pulse 96, temperature 98.4 F (36.9 C), temperature source Temporal, resp. rate 16, SpO2 98 %. There is no height or weight on file to calculate BMI.   Physical Exam:  Physical Exam Constitutional:      Appearance: She is well-developed.     Comments: Pleasant female. Cooperative with the exam.  HENT:     Head: Normocephalic and atraumatic.     Right Ear: Tympanic membrane, ear canal and external ear normal.     Left Ear: Tympanic membrane, ear canal and external ear normal.     Nose: No nasal deformity, septal deviation, mucosal edema or rhinorrhea.     Right Turbinates: Enlarged and swollen.     Left Turbinates: Enlarged and swollen.     Right Sinus: No maxillary sinus tenderness or frontal sinus tenderness.     Left Sinus: No maxillary sinus tenderness or frontal sinus tenderness.     Mouth/Throat:     Mouth: Mucous membranes are not pale and not dry.     Pharynx: Uvula midline.  Eyes:     General:        Right eye: No discharge.        Left eye: No discharge.     Conjunctiva/sclera: Conjunctivae normal.     Right eye: Right conjunctiva is not injected. No chemosis.    Left eye: Left conjunctiva is not injected. No chemosis.    Pupils: Pupils are equal, round, and reactive to light.  Cardiovascular:     Rate and Rhythm: Normal rate and regular rhythm.     Heart sounds: Normal heart sounds.  Pulmonary:     Effort: Pulmonary effort is normal. No tachypnea, accessory muscle usage or respiratory distress.     Breath sounds: Normal breath sounds. No wheezing, rhonchi or rales.     Comments: Moving  air well in all lung fields. No increased work of breathing. Chest:     Chest wall: No tenderness.  Lymphadenopathy:     Cervical: No cervical adenopathy.  Skin:    Coloration: Skin is not pale.     Findings: No abrasion, erythema, petechiae or rash. Rash is not papular, urticarial or vesicular.     Comments: No eczematous or urticarial lesions noted.  Neurological:     Mental Status: She is alert.      Diagnostic studies: none      Malachi Bonds, MD  [facility]

## 2020-08-20 NOTE — Patient Instructions (Addendum)
1. Moderate persistent asthma  - Lung function deferred today. - Stop the Breo temporarily and use Trelegy one puff once daily (contains Breo + another medication to help keep your lung open).  - Use albuterol as needed BEFORE physical activity to help you get through.  - Daily controller medication(s): Singulair 10mg  daily and Trelegy one puff once daily - Prior to physical activity: ProAir 2 puffs 10-15 minutes before physical activity. - Rescue medications: ProAir 4 puffs every 4-6 hours as needed - Asthma control goals:  * Full participation in all desired activities (may need albuterol before activity) * Albuterol use two time or less a week on average (not counting use with activity) * Cough interfering with sleep two time or less a month * Oral steroids no more than once a year * No hospitalizations  2. Adverse food reaction (cow's milk)  - Continue to avoid cow's milk since it causes vomiting.  - is up to date.   3. Perennial allergic rhinitis (horse, mouse, dust mites, cat and dog) - Continue with Zyrtec (cetirizine) 10mg  tablet once daily as needed.   4. Return in about 6 weeks (around 10/01/2020).    Please inform of any Emergency Department visits, hospitalizations, or changes in symptoms. Call 10/03/2020 before going to the ED for breathing or allergy symptoms since we might be able to fit you in for a sick visit. Feel free to contact us anytime with any questions, problems, or concerns.  It was a pleasure to see you again today!  Websites that have reliable patient information: 1. American Academy of Asthma, Allergy, and Immunology: www.aaaai.org 2. Food Allergy Research and Education (FARE): foodallergy.org 3. Mothers of Asthmatics: http://www.asthmacommunitynetwork.org 4. American College of Allergy, Asthma, and Immunology: www.acaai.org   COVID-19 Vaccine Information can be found at:  [place] For questions related to vaccine distribution or appointments, please email [email or web address] or call [phone].     Like [email or web address] on [phone] and Instagram for our latest updates!     HAPPY FALL!     Make sure you are registered to vote! If you have moved or changed any of your contact information, you will need to get this updated before voting!  In some cases, you MAY be able to register to vote online: [place]

## 2020-08-21 LAB — CBC WITH DIFFERENTIAL/PLATELET
Hemoglobin: 11.1 g/dL (ref 11.1–15.9)
Lymphs: 21 %
MCV: 96 fL (ref 79–97)
Monocytes Absolute: 0.6 10*3/uL (ref 0.1–0.9)

## 2020-08-21 MED ORDER — EPINEPHRINE 0.3 MG/0.3ML IJ SOAJ: 0.3000 mg | Freq: Once | INTRAMUSCULAR | 2 refills | Status: AC

## 2020-08-21 MED ORDER — MONTELUKAST SODIUM 10 MG PO TABS: 10.0000 mg | ORAL_TABLET | Freq: Every day | ORAL | 5 refills | Status: DC

## 2020-08-23 ENCOUNTER — Encounter: Payer: Self-pay | Admitting: Allergy & Immunology

## 2020-08-23 LAB — CBC WITH DIFFERENTIAL/PLATELET
Basophils Absolute: 0 10*3/uL (ref 0.0–0.2)
Basos: 0 %
EOS (ABSOLUTE): 0.1 10*3/uL (ref 0.0–0.4)
Eos: 1 %
Hematocrit: 31.4 % — ABNORMAL LOW (ref 34.0–46.6)
Immature Grans (Abs): 0 10*3/uL (ref 0.0–0.1)
Immature Granulocytes: 0 %
Lymphocytes Absolute: 1.8 10*3/uL (ref 0.7–3.1)
MCH: 33.8 pg — ABNORMAL HIGH (ref 26.6–33.0)
MCHC: 35.4 g/dL (ref 31.5–35.7)
Monocytes: 8 %
Neutrophils Absolute: 5.8 10*3/uL (ref 1.4–7.0)
Neutrophils: 70 %
Platelets: 265 10*3/uL (ref 150–450)
RBC: 3.28 x10E6/uL — ABNORMAL LOW (ref 3.77–5.28)
RDW: 13.5 % (ref 11.7–15.4)
WBC: 8.3 10*3/uL (ref 3.4–10.8)

## 2020-08-23 LAB — IGE: IgE (Immunoglobulin E), Serum: 31 IU/mL (ref 6–495)

## 2020-08-24 ENCOUNTER — Encounter: Payer: Self-pay | Admitting: Allergy & Immunology

## 2020-09-01 ENCOUNTER — Other Ambulatory Visit: Payer: Self-pay | Admitting: *Deleted

## 2020-09-01 NOTE — Telephone Encounter (Signed)
Patient called to proceed with a prescription for Trelegy.  [facility] [address] [facility]

## 2020-09-02 ENCOUNTER — Other Ambulatory Visit: Payer: Self-pay | Admitting: *Deleted

## 2020-09-02 MED ORDER — TRELEGY ELLIPTA 100-62.5-25 MCG/INH IN AEPB: 1.0000 | INHALATION_SPRAY | Freq: Every day | RESPIRATORY_TRACT | 5 refills | Status: DC

## 2020-10-06 ENCOUNTER — Ambulatory Visit: Payer: BC Managed Care – PPO | Admitting: Allergy & Immunology

## 2020-10-19 ENCOUNTER — Telehealth: Payer: Self-pay | Admitting: Allergy & Immunology

## 2020-10-19 ENCOUNTER — Other Ambulatory Visit: Payer: Self-pay

## 2020-10-19 MED ORDER — TRELEGY ELLIPTA 100-62.5-25 MCG/INH IN AEPB: 1.0000 | INHALATION_SPRAY | Freq: Every day | RESPIRATORY_TRACT | 5 refills | Status: DC

## 2020-10-19 NOTE — Telephone Encounter (Signed)
Patient has an appointment on 10/29/20. She is requesting a refill for Trelogy. [facility] [address].

## 2020-10-19 NOTE — Telephone Encounter (Signed)
Refill sent in and patient was notified 

## 2020-10-23 LAB — OB RESULTS CONSOLE RPR: RPR: NONREACTIVE

## 2020-10-29 ENCOUNTER — Encounter: Payer: Self-pay | Admitting: Allergy & Immunology

## 2020-10-29 ENCOUNTER — Ambulatory Visit: Payer: BC Managed Care – PPO | Admitting: Allergy & Immunology

## 2020-10-29 ENCOUNTER — Other Ambulatory Visit: Payer: Self-pay

## 2020-10-29 VITALS — BP 110/64 | HR 94 | Temp 97.2°F | Resp 16 | Ht 65.0 in | Wt 226.8 lb

## 2020-10-29 DIAGNOSIS — J3089 Other allergic rhinitis: Secondary | ICD-10-CM | POA: Diagnosis not present

## 2020-10-29 DIAGNOSIS — T7800XD Anaphylactic reaction due to unspecified food, subsequent encounter: Secondary | ICD-10-CM | POA: Diagnosis not present

## 2020-10-29 DIAGNOSIS — O99513 Diseases of the respiratory system complicating pregnancy, third trimester: Secondary | ICD-10-CM

## 2020-10-29 DIAGNOSIS — J45909 Unspecified asthma, uncomplicated: Secondary | ICD-10-CM

## 2020-10-29 DIAGNOSIS — J4540 Moderate persistent asthma, uncomplicated: Secondary | ICD-10-CM | POA: Diagnosis not present

## 2020-10-29 MED ORDER — ALBUTEROL SULFATE HFA 108 (90 BASE) MCG/ACT IN AERS: 2.0000 | INHALATION_SPRAY | RESPIRATORY_TRACT | 1 refills | Status: DC | PRN

## 2020-10-29 MED ORDER — EPINEPHRINE 0.3 MG/0.3ML IJ SOAJ: 0.3000 mg | Freq: Once | INTRAMUSCULAR | 2 refills | Status: AC

## 2020-10-29 NOTE — Progress Notes (Signed)
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 Moderate persistent asthma  - Lung function looked great today. - We are not going to make any changes at all since you are doing so well.  - Use albuterol as needed BEFORE physical activity to help you get through.  - Daily controller medication(s): Singulair 10mg  daily and Trelegy one puff once daily - Prior to physical activity: ProAir 2 puffs 10-15 minutes before physical activity. - Rescue medications: ProAir 4 puffs every 4-6 hours as needed - Asthma control goals:  * Full participation in all desired activities (may need albuterol before activity) * Albuterol use two time or less a week on average (not counting use with activity) * Cough interfering with sleep two time or less a month * Oral steroids no more than once a year * No hospitalizations  2. Adverse food reaction (cow's milk)  - Continue to avoid cow's milk since it causes vomiting.  - is up to date.   3. Perennial allergic rhinitis (horse, mouse, dust mites, cat and dog) - Continue with Zyrtec (cetirizine) 10mg  tablet once daily as needed.   4. Return in about 2 years (around 10/29/2022).    Subjective:   Cindy Hansen is a 39 y.o. female presenting today for follow up of  Chief Complaint  Patient presents with  . Asthma    Went without medication and had a few complications. Patient says she is fine now.     Cindy Hansen has a history of the following: Patient Active Problem List   Diagnosis Date Noted  . Restrictive lung disease 10/25/2017  . Adverse food reaction 10/25/2017    History obtained from: chart review and  patient.  Cindy Hansen is a 39 y.o. female presenting for a follow up visit. She was last seen in October 2021.  At that time, her lung function was not done.  We changed her from [facility] to Trelegy to see if this would help control her shortness of breath a little bit better.  She was doing well with her pregnancy but had noticed that her asthma and gotten worse during that time.  Recommended continued cows milk avoidance.  For her rhinitis, she was on Zyrtec 10 mg daily as needed.  Since last visit, she has done well.  Asthma/Respiratory Symptom History: She has had some issues at night. She has been having some heart burn due to the pregnancy. Currently she is on Pepc[facility] for her heart burn. This has helped a lot and she is very happy with how well she is doing. she has been adjusting what she is eating with less spicy foods etc. She has changed to the Trelegy and she has noticed a difference. She felt at first that it was not working as well, but then she realized that she was not taking it consistently. She estimates that she was using her rescue inhaler every 3 days if she is on her Trelegy consistently.   Allergic Rhinitis Symptom History: She remains on the Zyrtec as needed.  She has not needed any antibiotics at all.  Her ultrasounds have been normal. She is working on getting some part of her guest room ready for the baby. They are actually  working on moving anyway, so they are not putting a lot of work into the the nursery at this point. [place] are going [place] [facility], so she will not need referral to another allergist.  The baby's due date is March 21.  Cindy Hansen has not received her booster COVID19 vaccine, but she is going to wait until after she delivers.  Otherwise, there have been no changes to her past medical history, surgical history, family history, or social history.    Review of Systems  Constitutional: Negative.  Negative for chills, fever, malaise/fatigue and  weight loss.  HENT: Positive for congestion. Negative for ear discharge, ear pain and sinus pain.   Eyes: Negative for pain, discharge and redness.  Respiratory: Positive for shortness of breath. Negative for cough, sputum production and wheezing.   Cardiovascular: Negative.  Negative for chest pain and palpitations.  Gastrointestinal: Negative for abdominal pain, constipation, diarrhea, heartburn, nausea and vomiting.  Skin: Negative.  Negative for itching and rash.  Neurological: Negative for dizziness and headaches.  Endo/Heme/Allergies: Positive for environmental allergies. Does not bruise/bleed easily.       Objective:   Blood pressure 110/64, pulse 94, temperature (!) 97.2 F (36.2 C), resp. rate 16, height 5\' 5"  (1.651 m), weight 226 lb 12.8 oz (102.9 kg), SpO2 99 %. Body mass index is 37.74 kg/m.   Physical Exam:  Physical Exam Constitutional:      Appearance: She is well-developed.     Comments: Very pleasant female.  HENT:     Head: Normocephalic and atraumatic.     Right Ear: Tympanic membrane, ear canal and external ear normal.     Left Ear: Tympanic membrane, ear canal and external ear normal.     Nose: No nasal deformity, septal deviation, mucosal edema, rhinorrhea or epistaxis.     Right Turbinates: Enlarged and swollen.     Left Turbinates: Enlarged and swollen.     Right Sinus: No maxillary sinus tenderness or frontal sinus tenderness.     Left Sinus: No maxillary sinus tenderness or frontal sinus tenderness.     Comments: No polyps.  Marked congestion.    Mouth/Throat:     Mouth: Oropharynx is clear and moist. Mucous membranes are not pale and not dry.     Pharynx: Uvula midline.  Eyes:     General:        Right eye: No discharge.        Left eye: No discharge.     Extraocular Movements: EOM normal.     Conjunctiva/sclera: Conjunctivae normal.     Right eye: Right conjunctiva is not injected. No chemosis.    Left eye: Left conjunctiva is not injected.  No chemosis.    Pupils: Pupils are equal, round, and reactive to light.  Cardiovascular:     Rate and Rhythm: Normal rate and regular rhythm.     Heart sounds: Normal heart sounds.  Pulmonary:     Effort: Pulmonary effort is normal. No tachypnea, accessory muscle usage or respiratory distress.     Breath sounds: Normal breath sounds. No wheezing, rhonchi or rales.  Chest:     Chest wall: No tenderness.  Lymphadenopathy:     Cervical: No cervical adenopathy.  Skin:    Coloration: Skin is not pale.     Findings: No abrasion, erythema, petechiae or rash. Rash is not papular, urticarial or vesicular.  Neurological:     Mental Status: She is alert.  Psychiatric:  Mood and Affect: Mood and affect normal.        Behavior: Behavior is cooperative.      Diagnostic studies:    Spirometry: results normal (FEV1: 2.05/77%, FVC: 2.78/87%, FEV1/FVC: 74%).    Spirometry consistent with normal pattern.   Allergy Studies: none      Malachi Bonds, MD  [facility]

## 2020-10-29 NOTE — Patient Instructions (Addendum)
1. Moderate persistent asthma  - Lung function looked great today. - Ww are not going to make any changes today.  - Daily controller medication(s): Singulair 10mg  daily and Trelegy one puff once daily - Prior to physical activity: ProAir 2 puffs 10-15 minutes before physical activity. - Rescue medications: ProAir 4 puffs every 4-6 hours as needed - Asthma control goals:  * Full participation in all desired activities (may need albuterol before activity) * Albuterol use two time or less a week on average (not counting use with activity) * Cough interfering with sleep two time or less a month * Oral steroids no more than once a year * No hospitalizations  2. Adverse food reaction (cow's milk)  - Continue to avoid cow's milk since it causes vomiting.  - is up to date.   3. Perennial allergic rhinitis (horse, mouse, dust mites, cat and dog) - Continue with Zyrtec (cetirizine) 10mg  tablet once daily as needed.   4. Return in about 2 years (around 10/29/2022).    Please inform of any Emergency Department visits, hospitalizations, or changes in symptoms. Call 10/31/2022 before going to the ED for breathing or allergy symptoms since we might be able to fit you in for a sick visit. Feel free to contact us anytime with any questions, problems, or concerns.  It was a pleasure to see you again today!  Websites that have reliable patient information: 1. American Academy of Asthma, Allergy, and Immunology: www.aaaai.org 2. Food Allergy Research and Education (FARE): foodallergy.org 3. Mothers of Asthmatics: http://www.asthmacommunitynetwork.org 4. American College of Allergy, Asthma, and Immunology: www.acaai.org   COVID-19 Vaccine Information can be found at: [place] For questions related to vaccine distribution or appointments, please email [email or web address] or call [phone].     "Like" [email or web address] on Facebook and  Instagram for our latest updates!       Make sure you are registered to vote! If you have moved or changed any of your contact information, you will need to get this updated before voting!  In some cases, you MAY be able to register to vote online: [phone]

## 2020-11-01 DIAGNOSIS — J3089 Other allergic rhinitis: Secondary | ICD-10-CM | POA: Insufficient documentation

## 2020-11-01 DIAGNOSIS — T7800XA Anaphylactic reaction due to unspecified food, initial encounter: Secondary | ICD-10-CM | POA: Insufficient documentation

## 2020-11-01 DIAGNOSIS — J4540 Moderate persistent asthma, uncomplicated: Secondary | ICD-10-CM | POA: Insufficient documentation

## 2020-11-07 NOTE — L&D Delivery Note (Signed)
Delivery Note Labor onset:  01/17/21 Labor Onset Time: 0100 Complete dilation at 7:10 AM  Onset of pushing at 0844 FHR second stage Cat 2 Analgesia/Anesthesia intrapartum: epidural  Guided pushing with maternal urge. Delivery of a viable female at 0900. Fetal head delivered in LOA position.  Nuchal cord: N/A.  Infant placed on maternal abd, dried, and tactile stim. Spontaneous cry. Cord avulsion as mother was bringing infant to chest. Short cord noted. Clamped and trimmed by FOB.  2 RNs present for birth.  Cord blood sample collected Arterial cord blood sample collected: N/A  Placenta delivered Cindy Hansen via [name] Maneuver, intact, with 3 VC.  Active management of third stage.  Placenta to L&D. Uterine tone Firm, bleeding minimal  1 degree laceration identified.  Anesthesia: Epidural Repair: 3-0 vicryl  QBL/EBL (mL): 100 Complications: meconium, cord avulsion, short cord APGAR: APGAR (1 MIN): 8   APGAR (5 MINS): 9   APGAR (10 MINS):   Mom to postpartum.  Baby to Couplet care / Skin to Skin.  Gerhard Munch Selda Jalbert MSN, CNM 01/17/2021, 9:33 AM

## 2020-11-17 ENCOUNTER — Other Ambulatory Visit: Payer: Self-pay | Admitting: Allergy & Immunology

## 2020-11-19 ENCOUNTER — Ambulatory Visit: Payer: BC Managed Care – PPO | Admitting: Allergy & Immunology

## 2020-12-10 ENCOUNTER — Ambulatory Visit: Payer: Self-pay | Attending: Internal Medicine

## 2020-12-10 DIAGNOSIS — Z23 Encounter for immunization: Secondary | ICD-10-CM

## 2020-12-10 NOTE — Progress Notes (Signed)
   Covid-19 Vaccination Clinic  Name:  Cindy Hansen    MRN: [id number] DOB: [birth year]  12/10/2020  [name] was observed post Covid-19 immunization for 15 minutes without incident. She was provided with Vaccine Information Sheet and instruction to access the V-Safe system.   [name] was instructed to call 911 with any severe reactions post vaccine: Marland Kitchen Difficulty breathing  . Swelling of face and throat  . A fast heartbeat  . A bad rash all over body  . Dizziness and weakness   Immunizations Administered    Name Date Dose VIS Date Route   PFIZER Comrnaty(Gray TOP) Covid-19 Vaccine 12/10/2020  1:10 PM 0.3 mL 10/15/2020 Intramuscular   Manufacturer: [employer], [employer]   Lot: [id number]   NDC: [phone]

## 2020-12-18 ENCOUNTER — Other Ambulatory Visit: Payer: Self-pay | Admitting: Allergy & Immunology

## 2020-12-24 LAB — OB RESULTS CONSOLE GBS
GBS: NEGATIVE
GBS: POSITIVE

## 2021-01-05 ENCOUNTER — Other Ambulatory Visit: Payer: Self-pay

## 2021-01-05 ENCOUNTER — Encounter: Payer: Self-pay | Admitting: Allergy & Immunology

## 2021-01-05 ENCOUNTER — Ambulatory Visit: Payer: BC Managed Care – PPO | Admitting: Allergy & Immunology

## 2021-01-05 VITALS — BP 120/66 | HR 85 | Temp 97.5°F

## 2021-01-05 DIAGNOSIS — O99513 Diseases of the respiratory system complicating pregnancy, third trimester: Secondary | ICD-10-CM | POA: Diagnosis not present

## 2021-01-05 DIAGNOSIS — J45909 Unspecified asthma, uncomplicated: Secondary | ICD-10-CM

## 2021-01-05 DIAGNOSIS — J3089 Other allergic rhinitis: Secondary | ICD-10-CM

## 2021-01-05 DIAGNOSIS — J4540 Moderate persistent asthma, uncomplicated: Secondary | ICD-10-CM

## 2021-01-05 DIAGNOSIS — T7800XD Anaphylactic reaction due to unspecified food, subsequent encounter: Secondary | ICD-10-CM

## 2021-01-05 MED ORDER — BREO ELLIPTA 200-25 MCG/INH IN AEPB: INHALATION_SPRAY | RESPIRATORY_TRACT | 5 refills | Status: DC

## 2021-01-05 NOTE — Patient Instructions (Addendum)
1. Moderate persistent asthma  - Lung function looked great today. - We are going to change to Breo 200mg .  - Daily controller medication(s): Singulair 10mg  daily and Breo one puff once daily - Prior to physical activity: ProAir 2 puffs 10-15 minutes before physical activity. - Rescue medications: ProAir 4 puffs every 4-6 hours as needed - Asthma control goals:  * Full participation in all desired activities (may need albuterol before activity) * Albuterol use two time or less a week on average (not counting use with activity) * Cough interfering with sleep two time or less a month * Oral steroids no more than once a year * No hospitalizations  2. Adverse food reaction (cow's milk)  - Continue to avoid cow's milk since it causes vomiting.  - is up to date.   3. Perennial allergic rhinitis (horse, mouse, dust mites, cat and dog) - Continue with Zyrtec (cetirizine) 10mg  tablet once daily as needed.   4. Return in about 6 months (around 07/08/2021).    Please inform Audry Riles of any Emergency Department visits, hospitalizations, or changes in symptoms. Call before going to the ED for breathing or allergy symptoms since we might be able to fit you in for a sick visit. Feel free to contact 09/07/2021 anytime with any questions, problems, or concerns.  It was a pleasure to see you again today!  Websites that have reliable patient information: 1. American Academy of Asthma, Allergy, and Immunology: www.aaaai.org 2. Food Allergy Research and Education (FARE): foodallergy.org 3. Mothers of Asthmatics: http://www.asthmacommunitynetwork.org 4. American College of Allergy, Asthma, and Immunology: www.acaai.org   COVID-19 Vaccine Information can be found at: [place] For questions related to vaccine distribution or appointments, please email [email or web address] or call [phone].   We realize that you might be concerned  about having an allergic reaction to the COVID19 vaccines. To help with that concern, WE ARE OFFERING THE COVID19 VACCINES IN OUR OFFICE! Ask the front desk for dates!     "Like" [place] on Facebook and Instagram for our latest updates!      A healthy democracy works best when [email or web address] participate! Make sure you are registered to vote! If you have moved or changed any of your contact information, you will need to get this updated before voting!  In some cases, you MAY be able to register to vote online: [phone]

## 2021-01-05 NOTE — Progress Notes (Signed)
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 Moderate persistent asthma  - Lung function looked great today. - We are going to change to Breo 200mg .  - Daily controller medication(s): Singulair 10mg  daily and Breo one puff once daily - Prior to physical activity: ProAir 2 puffs 10-15 minutes before physical activity. - Rescue medications: ProAir 4 puffs every 4-6 hours as needed - Asthma control goals:  * Full participation in all desired activities (may need albuterol before activity) * Albuterol use two time or less a week on average (not counting use with activity) * Cough interfering with sleep two time or less a month * Oral steroids no more than once a year * No hospitalizations  2. Adverse food reaction (cow's milk)  - Continue to avoid cow's milk since it causes vomiting.  - is up to date.   3. Perennial allergic rhinitis (horse, mouse, dust mites, cat and dog) - Continue with Zyrtec (cetirizine) 10mg  tablet once daily as needed.   4. Return in about 6 months (around 07/08/2021).   Subjective:   Cindy Hansen is a 40 y.o. female presenting today for follow up of  Chief Complaint  Patient presents with  . Follow-up    Cindy Hansen has a history of the following: Patient Active Problem List   Diagnosis Date Noted  . Perennial allergic rhinitis 11/01/2020  . Moderate persistent asthma, uncomplicated 11/01/2020  . Anaphylactic shock due to adverse food reaction 11/01/2020  . Restrictive lung disease 10/25/2017  . Adverse food reaction 10/25/2017    History obtained from: chart review and patient.  Cindy Hansen is a 40 y.o. female  presenting for a follow up visit.  She was last seen in December 2021 right before Christmas.  At that time, her lung function looked great.  We did not make any medication changes since she was doing so well.  We continued with Singulair and Trelegy 1 puff once daily as well as albuterol as needed.  We recommended continued avoidance of cows milk.  For her rhinitis, would continue with Zyrtec 10 mg once daily as needed.  Since last visit, she has done very well.  Asthma/Respiratory Symptom History: She continues to have a strong aftertaste that "sits" in her throat. She does not get that with the Breo at all. She has not needed to use her rescue inhaler. She is sleeping well from an asthma perspective. Once she dropped a few weeks ago, she felt that she could expand her breathing more. The combination of the pregnancy and the asthma was controlling her symptoms well.   Allergic Rhinitis Symptom History: She has not had noticeable issues thus far. She is using the cetirizine daily when she goes out of the house. This is not a daily medication. But 4-5 days per week, she will use it if she is going to be around a trigger.   She is going to transition to virtual next Wednesday until she is born. She is going to be off for 8 weeks paid and then 2-4 weeks after depending on how she is doing.   She has had no issues with the pregnancy. She is looking forward to having the baby born and she has not had a lot of  issues during the pregnancy.   Otherwise, there have been no changes to her past medical history, surgical history, family history, or social history.    Review of Systems  Constitutional: Negative.  Negative for chills, fever, malaise/fatigue and weight loss.  HENT: Negative.  Negative for congestion, ear discharge, ear pain, sinus pain and sore throat.   Eyes: Negative for pain, discharge and redness.  Respiratory: Negative for cough, sputum production, shortness of breath and wheezing.    Cardiovascular: Negative.  Negative for chest pain and palpitations.  Gastrointestinal: Negative for abdominal pain, diarrhea, heartburn, nausea and vomiting.  Skin: Negative.  Negative for itching and rash.  Neurological: Negative for dizziness and headaches.  Endo/Heme/Allergies: Negative for environmental allergies. Does not bruise/bleed easily.       Objective:   Blood pressure 120/66, pulse 85, temperature (!) 97.5 F (36.4 C), temperature source Temporal, SpO2 98 %. There is no height or weight on file to calculate BMI.   Physical Exam:  Physical Exam Constitutional:      Appearance: She is well-developed.  HENT:     Head: Normocephalic and atraumatic.     Right Ear: Tympanic membrane, ear canal and external ear normal.     Left Ear: Tympanic membrane, ear canal and external ear normal.     Nose: No nasal deformity, septal deviation, mucosal edema, rhinorrhea or epistaxis.     Right Turbinates: Enlarged and swollen.     Left Turbinates: Enlarged and swollen.     Right Sinus: No maxillary sinus tenderness or frontal sinus tenderness.     Left Sinus: No maxillary sinus tenderness or frontal sinus tenderness.     Comments: Turbinates enlarged bilaterally. Some clear discharge.     Mouth/Throat:     Mouth: Oropharynx is clear and moist. Mucous membranes are not pale and not dry.     Pharynx: Uvula midline.  Eyes:     General:        Right eye: No discharge.        Left eye: No discharge.     Extraocular Movements: EOM normal.     Conjunctiva/sclera: Conjunctivae normal.     Right eye: Right conjunctiva is not injected. No chemosis.    Left eye: Left conjunctiva is not injected. No chemosis.    Pupils: Pupils are equal, round, and reactive to light.  Cardiovascular:     Rate and Rhythm: Normal rate and regular rhythm.     Heart sounds: Normal heart sounds.  Pulmonary:     Effort: Pulmonary effort is normal. No tachypnea, accessory muscle usage or respiratory  distress.     Breath sounds: Normal breath sounds. No wheezing, rhonchi or rales.     Comments: Moving air well in all lung fields. No increased work of breathing noted.  Chest:     Chest wall: No tenderness.  Lymphadenopathy:     Cervical: No cervical adenopathy.  Skin:    Coloration: Skin is not pale.     Findings: No abrasion, erythema, petechiae or rash. Rash is not papular, urticarial or vesicular.  Neurological:     Mental Status: She is alert.  Psychiatric:        Mood and Affect: Mood and affect normal.        Behavior: Behavior is cooperative.      Diagnostic studies:    Spirometry: results normal (FEV1: 2.16/82%, FVC: 3.00/94%, FEV1/FVC: 72%).    Spirometry consistent with normal pattern.   Allergy Studies: none    Francis Dowse  Ernst Bowler, MD  [facility]

## 2021-01-06 ENCOUNTER — Encounter: Payer: Self-pay | Admitting: Allergy & Immunology

## 2021-01-17 ENCOUNTER — Inpatient Hospital Stay (HOSPITAL_COMMUNITY)
Admission: AD | Admit: 2021-01-17 | Discharge: 2021-01-19 | DRG: 806 | Disposition: A | Discharge status: Home / Self Care | Payer: BC Managed Care – PPO | Attending: Obstetrics and Gynecology | Admitting: Obstetrics and Gynecology

## 2021-01-17 ENCOUNTER — Inpatient Hospital Stay (HOSPITAL_COMMUNITY): Payer: BC Managed Care – PPO | Admitting: Anesthesiology

## 2021-01-17 ENCOUNTER — Other Ambulatory Visit: Payer: Self-pay

## 2021-01-17 ENCOUNTER — Encounter (HOSPITAL_COMMUNITY): Payer: Self-pay | Admitting: Obstetrics & Gynecology

## 2021-01-17 DIAGNOSIS — O26893 Other specified pregnancy related conditions, third trimester: Secondary | ICD-10-CM | POA: Diagnosis present

## 2021-01-17 DIAGNOSIS — O99824 Streptococcus B carrier state complicating childbirth: Secondary | ICD-10-CM | POA: Diagnosis present

## 2021-01-17 DIAGNOSIS — J4540 Moderate persistent asthma, uncomplicated: Secondary | ICD-10-CM | POA: Diagnosis present

## 2021-01-17 DIAGNOSIS — A6000 Herpesviral infection of urogenital system, unspecified: Secondary | ICD-10-CM | POA: Diagnosis present

## 2021-01-17 DIAGNOSIS — N841 Polyp of cervix uteri: Secondary | ICD-10-CM | POA: Diagnosis present

## 2021-01-17 DIAGNOSIS — O9952 Diseases of the respiratory system complicating childbirth: Secondary | ICD-10-CM | POA: Diagnosis present

## 2021-01-17 DIAGNOSIS — Z3A39 39 weeks gestation of pregnancy: Secondary | ICD-10-CM

## 2021-01-17 DIAGNOSIS — Z20822 Contact with and (suspected) exposure to covid-19: Secondary | ICD-10-CM | POA: Diagnosis present

## 2021-01-17 DIAGNOSIS — O9832 Other infections with a predominantly sexual mode of transmission complicating childbirth: Secondary | ICD-10-CM | POA: Diagnosis present

## 2021-01-17 DIAGNOSIS — O3443 Maternal care for other abnormalities of cervix, third trimester: Secondary | ICD-10-CM | POA: Diagnosis present

## 2021-01-17 DIAGNOSIS — B009 Herpesviral infection, unspecified: Secondary | ICD-10-CM | POA: Diagnosis not present

## 2021-01-17 LAB — CBC
HCT: 31.8 % — ABNORMAL LOW (ref 36.0–46.0)
Hemoglobin: 11.4 g/dL — ABNORMAL LOW (ref 12.0–15.0)
MCH: 33.3 pg (ref 26.0–34.0)
MCHC: 35.8 g/dL (ref 30.0–36.0)
MCV: 93 fL (ref 80.0–100.0)
Platelets: 214 10*3/uL (ref 150–400)
RBC: 3.42 MIL/uL — ABNORMAL LOW (ref 3.87–5.11)
RDW: 14.1 % (ref 11.5–15.5)
WBC: 9.9 10*3/uL (ref 4.0–10.5)
nRBC: 0 % (ref 0.0–0.2)

## 2021-01-17 LAB — TYPE AND SCREEN
ABO/RH(D): A POS
Antibody Screen: NEGATIVE

## 2021-01-17 LAB — RESP PANEL BY RT-PCR (FLU A&B, COVID) ARPGX2
Influenza A by PCR: NEGATIVE
Influenza B by PCR: NEGATIVE
SARS Coronavirus 2 by RT PCR: NEGATIVE

## 2021-01-17 LAB — RPR: RPR Ser Ql: NONREACTIVE

## 2021-01-17 MED ORDER — PRENATAL MULTIVITAMIN CH
1.0000 | ORAL_TABLET | Freq: Every day | ORAL | Status: DC
  Administered 2021-01-17 – 2021-01-18 (×2): 1 via ORAL
  Filled 2021-01-17 (×2): qty 1

## 2021-01-17 MED ORDER — LACTATED RINGERS IV SOLN: 500.0000 mL | Freq: Once | INTRAVENOUS | Status: DC

## 2021-01-17 MED ORDER — IBUPROFEN 600 MG PO TABS
600.0000 mg | ORAL_TABLET | Freq: Four times a day (QID) | ORAL | Status: DC
  Administered 2021-01-17 – 2021-01-18 (×4): 600 mg via ORAL
  Filled 2021-01-17 (×5): qty 1

## 2021-01-17 MED ORDER — COCONUT OIL OIL: 1.0000 "application " | TOPICAL_OIL | Status: DC | PRN

## 2021-01-17 MED ORDER — TETANUS-DIPHTH-ACELL PERTUSSIS 5-2.5-18.5 LF-MCG/0.5 IM SUSY: 0.5000 mL | PREFILLED_SYRINGE | Freq: Once | INTRAMUSCULAR | Status: DC

## 2021-01-17 MED ORDER — EPHEDRINE 5 MG/ML INJ: 10.0000 mg | INTRAVENOUS | Status: DC | PRN

## 2021-01-17 MED ORDER — ACETAMINOPHEN 325 MG PO TABS: 650.0000 mg | ORAL_TABLET | ORAL | Status: DC | PRN

## 2021-01-17 MED ORDER — ONDANSETRON HCL 4 MG/2ML IJ SOLN: 4.0000 mg | Freq: Four times a day (QID) | INTRAMUSCULAR | Status: DC | PRN

## 2021-01-17 MED ORDER — OXYCODONE-ACETAMINOPHEN 5-325 MG PO TABS: 2.0000 | ORAL_TABLET | ORAL | Status: DC | PRN

## 2021-01-17 MED ORDER — PHENYLEPHRINE 40 MCG/ML (10ML) SYRINGE FOR IV PUSH (FOR BLOOD PRESSURE SUPPORT): 80.0000 ug | PREFILLED_SYRINGE | INTRAVENOUS | Status: DC | PRN

## 2021-01-17 MED ORDER — SIMETHICONE 80 MG PO CHEW: 80.0000 mg | CHEWABLE_TABLET | ORAL | Status: DC | PRN

## 2021-01-17 MED ORDER — FENTANYL-BUPIVACAINE-NACL 0.5-0.125-0.9 MG/250ML-% EP SOLN: 12.0000 mL/h | EPIDURAL | Status: DC | PRN

## 2021-01-17 MED ORDER — FENTANYL CITRATE (PF) 100 MCG/2ML IJ SOLN: 50.0000 ug | INTRAMUSCULAR | Status: DC | PRN

## 2021-01-17 MED ORDER — OXYCODONE HCL 5 MG PO TABS: 5.0000 mg | ORAL_TABLET | ORAL | Status: DC | PRN

## 2021-01-17 MED ORDER — LACTATED RINGERS IV SOLN: 500.0000 mL | INTRAVENOUS | Status: DC | PRN

## 2021-01-17 MED ORDER — OXYCODONE HCL 5 MG PO TABS
10.0000 mg | ORAL_TABLET | ORAL | Status: DC | PRN
Start: 2021-01-17 — End: 2021-01-19

## 2021-01-17 MED ORDER — OXYCODONE-ACETAMINOPHEN 5-325 MG PO TABS: 1.0000 | ORAL_TABLET | ORAL | Status: DC | PRN

## 2021-01-17 MED ORDER — BENZOCAINE-MENTHOL 20-0.5 % EX AERO: 1.0000 "application " | INHALATION_SPRAY | CUTANEOUS | Status: DC | PRN

## 2021-01-17 MED ORDER — DIPHENHYDRAMINE HCL 50 MG/ML IJ SOLN: 12.5000 mg | INTRAMUSCULAR | Status: DC | PRN

## 2021-01-17 MED ORDER — ONDANSETRON HCL 4 MG PO TABS: 4.0000 mg | ORAL_TABLET | ORAL | Status: DC | PRN

## 2021-01-17 MED ORDER — FENTANYL-BUPIVACAINE-NACL 0.5-0.125-0.9 MG/250ML-% EP SOLN
EPIDURAL | Status: DC | PRN
  Administered 2021-01-17: 12 mL/h via EPIDURAL

## 2021-01-17 MED ORDER — DIBUCAINE (PERIANAL) 1 % EX OINT: 1.0000 "application " | TOPICAL_OINTMENT | CUTANEOUS | Status: DC | PRN

## 2021-01-17 MED ORDER — FENTANYL-BUPIVACAINE-NACL 0.5-0.125-0.9 MG/250ML-% EP SOLN
EPIDURAL | Status: AC
  Filled 2021-01-17: qty 250

## 2021-01-17 MED ORDER — SODIUM CHLORIDE 0.9 % IV SOLN
2.0000 g | Freq: Once | INTRAVENOUS | Status: AC
  Administered 2021-01-17: 2 g via INTRAVENOUS
  Filled 2021-01-17: qty 2000

## 2021-01-17 MED ORDER — WITCH HAZEL-GLYCERIN EX PADS: 1.0000 "application " | MEDICATED_PAD | CUTANEOUS | Status: DC | PRN

## 2021-01-17 MED ORDER — OXYTOCIN-SODIUM CHLORIDE 30-0.9 UT/500ML-% IV SOLN
2.5000 [IU]/h | INTRAVENOUS | Status: DC
  Administered 2021-01-17: 2.5 [IU]/h via INTRAVENOUS
  Filled 2021-01-17: qty 500

## 2021-01-17 MED ORDER — ZOLPIDEM TARTRATE 5 MG PO TABS: 5.0000 mg | ORAL_TABLET | Freq: Every evening | ORAL | Status: DC | PRN

## 2021-01-17 MED ORDER — SENNOSIDES-DOCUSATE SODIUM 8.6-50 MG PO TABS
2.0000 | ORAL_TABLET | Freq: Every day | ORAL | Status: DC
  Administered 2021-01-18: 2 via ORAL
  Filled 2021-01-17: qty 2

## 2021-01-17 MED ORDER — LACTATED RINGERS IV SOLN: INTRAVENOUS | Status: DC

## 2021-01-17 MED ORDER — DIPHENHYDRAMINE HCL 25 MG PO CAPS: 25.0000 mg | ORAL_CAPSULE | Freq: Four times a day (QID) | ORAL | Status: DC | PRN

## 2021-01-17 MED ORDER — ONDANSETRON HCL 4 MG/2ML IJ SOLN: 4.0000 mg | INTRAMUSCULAR | Status: DC | PRN

## 2021-01-17 MED ORDER — LIDOCAINE HCL (PF) 1 % IJ SOLN: 30.0000 mL | INTRAMUSCULAR | Status: DC | PRN

## 2021-01-17 MED ORDER — SODIUM CHLORIDE 0.9 % IV SOLN
1.0000 g | INTRAVENOUS | Status: DC
  Administered 2021-01-17: 1 g via INTRAVENOUS
  Filled 2021-01-17: qty 1000

## 2021-01-17 MED ORDER — SOD CITRATE-CITRIC ACID 500-334 MG/5ML PO SOLN: 30.0000 mL | ORAL | Status: DC | PRN

## 2021-01-17 MED ORDER — LIDOCAINE HCL (PF) 1 % IJ SOLN
INTRAMUSCULAR | Status: DC | PRN
  Administered 2021-01-17: 6 mL via EPIDURAL

## 2021-01-17 MED ORDER — OXYTOCIN BOLUS FROM INFUSION
333.0000 mL | Freq: Once | INTRAVENOUS | Status: AC
  Administered 2021-01-17: 333 mL via INTRAVENOUS

## 2021-01-17 NOTE — Anesthesia Preprocedure Evaluation (Signed)
Anesthesia Evaluation  Patient identified by MRN, date of birth, ID band Patient awake    Reviewed: Allergy & Precautions, H&P , NPO status , Patient's Chart, lab work & pertinent test results, reviewed documented beta blocker date and time   Airway Mallampati: II  TM Distance: >3 FB Neck ROM: full    Dental no notable dental hx.    Pulmonary asthma ,    Pulmonary exam normal breath sounds clear to auscultation       Cardiovascular negative cardio ROS Normal cardiovascular exam Rhythm:regular Rate:Normal     Neuro/Psych negative neurological ROS  negative psych ROS   GI/Hepatic negative GI ROS, Neg liver ROS,   Endo/Other  negative endocrine ROS  Renal/GU negative Renal ROS  negative genitourinary   Musculoskeletal   Abdominal   Peds  Hematology negative hematology ROS (+)   Anesthesia Other Findings   Reproductive/Obstetrics (+) Pregnancy                             Anesthesia Physical Anesthesia Plan  ASA: III  Anesthesia Plan: Epidural   Post-op Pain Management:    Induction:   PONV Risk Score and Plan:   Airway Management Planned: Natural Airway  Additional Equipment: None  Intra-op Plan:   Post-operative Plan:   Informed Consent: I have reviewed the patients History and Physical, chart, labs and discussed the procedure including the risks, benefits and alternatives for the proposed anesthesia with the patient or authorized representative who has indicated his/her understanding and acceptance.     Dental Advisory Given  Plan Discussed with: Anesthesiologist  Anesthesia Plan Comments: (Labs checked- platelets confirmed with RN in room. Fetal heart tracing, per RN, reported to be stable enough for sitting procedure. Discussed epidural, and patient consents to the procedure:  included risk of possible headache,backache, failed block, allergic reaction, and nerve  injury. This patient was asked if she had any questions or concerns before the procedure started.)        Anesthesia Quick Evaluation

## 2021-01-17 NOTE — H&P (Signed)
OB ADMISSION/ HISTORY & PHYSICAL:  Admission Date: 01/17/2021  1:42 AM  Admit Diagnosis: Normal labor  Cindy Hansen is a 40 y.o. female G1P0 [redacted]w[redacted]d presenting for labor eval. Endorses active FM, denies LOF and vaginal bleeding. Ctx began approx 24 hrs ago and worsened in the past 2-3 hrs. Hx of asthma, last attack in early 2021. 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   History of current pregnancy: G1P0   Primary OB Provider: Dr. Connye Burkitt Patient entered care with [facility] at 9+3 wks.   EDC by 1st trimester [place]    Anatomy scan:  18 wks, complete w/ posterior placenta.   Antenatal testing: for AMA started at 32 weeks Last evaluation: 35+3 wks EFW 5# 14 oz (2666 g) 47%/vertex/posterior placenta Significant prenatal events:  Patient Active Problem List   Diagnosis Date Noted  . Normal labor 01/17/2021  . Perennial allergic rhinitis 11/01/2020  . Moderate persistent asthma, uncomplicated 11/01/2020  . Anaphylactic shock due to adverse food reaction 11/01/2020  . Restrictive lung disease 10/25/2017  . Adverse food reaction 10/25/2017    Prenatal Labs: ABO, Rh: --/--/PENDING (03/13 0303) Antibody: PENDING (03/13 0303) Rubella:   immune RPR:   NR HBsAg:   NR HIV:   NR GTT: passed 1 hr GBS:   pos GC/CHL: neg/neg Genetics: Horizon neg Tdap/influenza vaccines: tdap and flu current   OB History  Gravida Para Term Preterm AB Living  1            SAB IAB Ectopic Multiple Live Births               # Outcome Date GA Lbr Len/2nd Weight Sex Delivery Anes PTL Lv  1 Current             Medical / Surgical History: Past medical history:  Past Medical History:  Diagnosis Date  . Asthma   . Eczema   . Prediabetes 03/2020    Past surgical history:  Past Surgical History:  Procedure Laterality Date  . NO PAST SURGERIES     Family History:  Family History  Problem Relation Age of Onset  . High blood pressure Mother   . Diabetes Mellitus II  Father   . Mental illness Father     Social History:  reports that she has never smoked. She has never used smokeless tobacco. She reports that she does not drink alcohol and does not use drugs.  Allergies: Patient has no known allergies.   Current Medications at time of admission:  Prior to Admission medications   Medication Sig Start Date End Date Taking? Authorizing Provider  albuterol (VENTOLIN HFA) 108 (90 Base) MCG/ACT inhaler INHALE 2 PUFFS INTO THE LUNGS EVERY 4 HOURS AS NEEDED FOR WHEEZING OR SHORTNESS OF BREATH. 12/18/20   Cindy Spruce, MD  BREO ELLIPTA 200-25 MCG/INH AEPB TAKE 1 PUFF BY MOUTH EVERY DAY 01/05/21   Cindy Spruce, MD  cetirizine (ZYRTEC) 10 MG tablet TAKE 1 TABLET BY MOUTH EVERY DAY 11/17/20   Cindy Spruce, MD  Fluticasone-Umeclidin-Vilant (TRELEGY ELLIPTA) 100-62.5-25 MCG/INH AEPB Inhale 1 puff into the lungs daily. 10/19/20   Cindy Spruce, MD  Multiple Vitamin (MULTIVITAMIN) tablet Take 1 tablet by mouth daily.    [provider]  Prenatal Vit-Fe Fumarate-FA (MULTIVITAMIN-PRENATAL) 27-0.8 MG TABS tablet Take 1 tablet by mouth daily at 12 noon.    [provider]  TRI-ESTARYLLA 0.18/0.215/0.25 MG-35 MCG tablet Take 1 tablet by mouth daily.  Patient not taking: No sig reported 11/11/19   [provider]    Review of Systems: Constitutional: Negative   HENT: Negative   Eyes: Negative   Respiratory: Negative   Cardiovascular: Negative   Gastrointestinal: Negative  Genitourinary: pos for bloody show, neg for LOF   Musculoskeletal: Negative   Skin: Negative   Neurological: Negative   Endo/Heme/Allergies: Negative   Psychiatric/Behavioral: Negative    Physical Exam: VS: Blood pressure 131/87, pulse 86, temperature 98.4 F (36.9 C), temperature source Oral, SpO2 99 %. AAO x3, no signs of distress Cardiovascular: RRR Respiratory: Lung fields clear to ausculation GU/GI: Abdomen gravid, non-tender,  non-distended, active FM, vertex, EFW 7# per Leopold's Extremities: trace edema, negative for pain, tenderness, and cords  Cervical exam:Dilation: 6 Effacement (%): 90 Station: -2 Exam by:: A. Sedano,RN FHR: baseline rate 135 / variability moderate / accelerations present / absent decelerations TOCO: 3-4 min   Prenatal Transfer Tool  Maternal Diabetes: No Genetic Screening: Normal Maternal Ultrasounds/Referrals: Normal Fetal Ultrasounds or other Referrals:  None Maternal Substance Abuse:  No Significant Maternal Medications:  Meds include: Other:  Albuterol and Proventil inhalers Significant Maternal Lab Results: Group B Strep positive    Assessment: 40 y.o. G1P0 [redacted]w[redacted]d Spontaneous labor  Active stage of labor FHR category 1 GBS pos Pain management plan: epidural Hx Asthma    -well-managed on Albuterol, Breo Ellipta, and Trelegy Hx HSV    -neg speculum exam, no prodromal symptoms, Valtrex since 35 wks  Plan:  Admit to L&D Routine admission orders Epidural PRN Ampicillin for GBS prophylaxis AROM after adequate GBS prophylaxis  Dr Anselmo Pickler notified of admission and plan of care  Roma Schanz MSN, CNM 01/17/2021 3:29 AM

## 2021-01-17 NOTE — Lactation Note (Signed)
This note was copied from a baby's chart. Lactation Consultation Note  Patient Name: Cindy Hansen [id number] Date: 01/17/2021 Reason for consult: Follow-up assessment Age:40 hours  Visited with mom of 12 hours old FT female, she's a P1 and reported (+) breast changes during the pregnancy. Baby was having her bath when LC entered the room, offered latch assistance and mom agreed to take baby to breast, but as soon as baby was taken STS she fell asleep and mom wished not to disturb her. Asked mom to call for assistance when needed.  Per mom BF is going well and feedings at the breast have been comfortable so far. When [facility] checked her nipples, they're intact and showed no signs of trauma. Reviewed normal newborn behavior, cluster feeding, feeding cues, size of baby's stomach, lactogenesis II, pumping & going back to work, and products for breast care. Instructed mom to use her own EBM as the first line of treatment for the prevention/treatment of sore nipples.  Feeding plan:  1. Encouraged mom to feed baby STS 8-12 times/24 hours or sooner if feeding cues are present 2. Hand expression and spoon feeding were also encouraged  BF brochure, BF resources and feeding diary were reviewed. FOB present and supportive. Parents reported all questions and concerns were answered, they're both aware of LC OP services and will call PRN.   Maternal Data    Feeding Mother's Current Feeding Choice: Breast Milk  LATCH Score                    Lactation Tools Discussed/Used    Interventions Interventions: Breast feeding basics reviewed;Hand express;Breast massage  Discharge Pump: Personal (Medela and Lansinoh DEBP at home)  Consult Status Consult Status: Follow-up Date: 01/18/21 Follow-up type: In-patient    Cindy Hansen 01/17/2021, 9:52 PM

## 2021-01-17 NOTE — Anesthesia Procedure Notes (Signed)
Epidural Patient location during procedure: OB Start time: 01/17/2021 3:47 AM End time: 01/17/2021 3:52 AM  Staffing Anesthesiologist: Bethena Midget, MD  Preanesthetic Checklist Completed: patient identified, IV checked, site marked, risks and benefits discussed, surgical consent, monitors and equipment checked, pre-op evaluation and timeout performed  Epidural Patient position: sitting Prep: DuraPrep and site prepped and draped Patient monitoring: continuous pulse ox and blood pressure Approach: midline Location: L3-L4 Injection technique: LOR air  Needle:  Needle type: Tuohy  Needle gauge: 17 G Needle length: 9 cm and 9 Needle insertion depth: 9 cm Catheter type: closed end flexible Catheter size: 19 Gauge Catheter at skin depth: 14 cm Test dose: negative  Assessment Events: blood not aspirated, injection not painful, no injection resistance, no paresthesia and negative IV test

## 2021-01-17 NOTE — Anesthesia Postprocedure Evaluation (Signed)
Anesthesia Post Note  Patient: Cindy Hansen  Procedure(s) Performed: AN AD HOC LABOR EPIDURAL     Patient location during evaluation: Mother Baby Anesthesia Type: Epidural Level of consciousness: awake and alert and oriented Pain management: satisfactory to patient Vital Signs Assessment: post-procedure vital signs reviewed and stable Respiratory status: respiratory function stable Cardiovascular status: stable Postop Assessment: no headache, no backache, epidural receding, patient able to bend at knees, no signs of nausea or vomiting, adequate PO intake and able to ambulate Anesthetic complications: no   No complications documented.  Last Vitals:  Vitals:   01/17/21 1226 01/17/21 1330  BP: (!) 123/59 114/65  Pulse: 71 76  Resp: 18 16  Temp: 37.1 C 36.7 C  SpO2:      Last Pain:  Vitals:   01/17/21 1330  TempSrc: Oral  PainSc:    Pain Goal:                   [name]

## 2021-01-17 NOTE — Plan of Care (Signed)
Pt demonstrated understanding 

## 2021-01-17 NOTE — Lactation Note (Signed)
Lactation Consultation Note  Patient Name: Cindy Hansen Date: 01/17/2021 Reason for consult: L&D Initial assessment Age:40 y.o.  Baby was demonstrating feeding cues when LC arrived. Assisted with positioning and baby latched easily. Provided brief ed regarding feeding norms in 1st 12-24 hours. Encouraged sts and feeding with cues. FOB present at bedside and return demonstrated positioning/latch assistance.  Mother and baby continued bf'ing when LC left room. Parents are aware of LC services on MBU.  Maternal Data Has patient been taught Hand Expression?: Yes Does the patient have breastfeeding experience prior to this delivery?: No +colostrum expression Breast are symmetrical and with normal development  Feeding Mother's Current Feeding Choice: Breast Milk  LATCH Score Latch: Grasps breast easily, tongue down, lips flanged, rhythmical sucking.  Audible Swallowing: Spontaneous and intermittent  Type of Nipple: Everted at rest and after stimulation  Comfort (Breast/Nipple): Soft / non-tender  Hold (Positioning): Full assist, staff holds infant at breast  LATCH Score: 8   Interventions Interventions: Education;Support pillows;Breast feeding basics reviewed;Assisted with latch;Position options;Skin to skin;Hand express;Adjust position  Consult Status Consult Status: Follow-up Follow-up type: In-patient  Elder Negus, MA IBCLC 01/17/2021, 9:49 AM

## 2021-01-17 NOTE — MAU Note (Signed)
Pt reports contractions x 24 hours, denies bleeding or ROM. Reports good fetal movement.

## 2021-01-18 LAB — CBC
HCT: 29 % — ABNORMAL LOW (ref 36.0–46.0)
Hemoglobin: 10.2 g/dL — ABNORMAL LOW (ref 12.0–15.0)
MCH: 32.9 pg (ref 26.0–34.0)
MCHC: 35.2 g/dL (ref 30.0–36.0)
MCV: 93.5 fL (ref 80.0–100.0)
Platelets: 159 10*3/uL (ref 150–400)
RBC: 3.1 MIL/uL — ABNORMAL LOW (ref 3.87–5.11)
RDW: 14.6 % (ref 11.5–15.5)
WBC: 9.3 10*3/uL (ref 4.0–10.5)
nRBC: 0 % (ref 0.0–0.2)

## 2021-01-18 NOTE — Progress Notes (Signed)
Postpartum Note Day #1  S:  Patient doing well.  Pain controlled.  Tolerating regular diet.   Ambulating and voiding without difficulty.   Denies fevers, chills, chest pain, SOB, N/V, or worsening bilateral LE edema.  Lochia: Moderate Infant feeding:  Breast Circumcision:  NA, female infant Contraception:  None  O: Temp:  [97.6 F (36.4 C)-98.7 F (37.1 C)] 98.2 F (36.8 C) (03/14 0545) Pulse Rate:  [63-157] 63 (03/14 0545) Resp:  [16-20] 20 (03/14 0545) BP: (82-150)/(57-90) 110/85 (03/14 0545) SpO2:  [96 %-99 %] 98 % (03/14 0545) Gen: NAD, pleasant and cooperative CV: RRR Resp: CTAB, no wheezes/rales/rhonchi Abdomen: soft, non-distended, non-tender throughout Uterus: firm, non-tender, below umbilicus Ext: trace bilateral LE edema, no bilateral calf tenderness, SCDs on and working  Labs: Recent Labs    01/17/21 0358 01/18/21 0535  HGB 11.4* 10.2*    A/P: Patient is a 40 y.o. G1P1001 PPD#1 s/p SVD.  S/p SVD - Pain well controlled  - GU: UOP is adequate - GI: Tolerating regular diet - Activity: encouraged sitting up to chair and ambulation as tolerated - DVT Prophylaxis: SCDs in bed, early ambulation - Labs: stable as above  Asthma (mild, intermittent) - Follows with [facility] - Medication:  Triology - Currently asymptomatic  Disposition:  D/C home PPD#2   Steva Ready, DO [phone] (office)

## 2021-01-18 NOTE — Lactation Note (Signed)
This note was copied from a baby's chart. Lactation Consultation Note Baby is 61 hrs old at time of consult. Mom sitting in chair BF baby when [facility] entered room.  Asked mom if baby has had any voids or stools since 0400 am. Mom stated no. Suggested mom used DEBP for extra stimulation and if she pumped anything BM we would give it back to the baby.  Mom asked question about pumping. LC answered them. Mom declined pumping at this time d/t baby is cluster feeding.  Suggested mom massage breast at intervals to give baby 50% more during a feeding. Mom stated she massages breast before she latches. Praised mom for "priming the line" for the baby. Good to massage occasionally during feedings. Also encouraged mom to assess breast before latching and after feeding to see if the breast is softer. Explained sometimes you can tell before the milk comes and sometimes you can't especially if breast has any swelling. Mom stated she doesn't have any swelling.  FOB asked how do you know if the baby is getting anything or not. LC explained that why documenting the baby's output and feeding are so important. That tells [place] if the baby is getting anything or not by the out put and weight loss.  LC mentioned to mom that we have Donor Milk if she is interested in supplementing. Mom stated thank you. Encouraged mom to call for [facility] tonight if needed.    Patient Name: Cindy Hansen [id number] Date: 01/18/2021 Reason for consult: Follow-up assessment;Infant < 6lbs;Primapara;Term Age:51 hours  Maternal Data    Feeding    LATCH Score Latch: Grasps breast easily, tongue down, lips flanged, rhythmical sucking.  Audible Swallowing: A few with stimulation  Type of Nipple: Everted at rest and after stimulation  Comfort (Breast/Nipple): Soft / non-tender  Hold (Positioning): No assistance needed to correctly position infant at breast.  LATCH Score: 9   Lactation Tools Discussed/Used     Interventions Interventions: Breast feeding basics reviewed  Discharge Pump: Declined  Consult Status Consult Status: Follow-up Date: 01/19/21 Follow-up type: In-patient    Charyl Dancer 01/18/2021, 11:07 PM

## 2021-01-19 MED ORDER — IBUPROFEN 800 MG PO TABS: 800.0000 mg | ORAL_TABLET | Freq: Three times a day (TID) | ORAL | 0 refills | Status: DC | PRN

## 2021-01-19 NOTE — Lactation Note (Signed)
This note was copied from a baby's chart. Lactation Consultation Note  Patient Name: Girl Emelda Kohlbeck [id number] Date: 01/19/2021 Reason for consult: Follow-up assessment;Term;Infant weight loss;Other (Comment) (spoke to dad mom in the bathroom , will F/U) Age:40 hours  Maternal Data    Feeding Mother's Current Feeding Choice: Breast Milk  LATCH Score                    Lactation Tools Discussed/Used    Interventions    Discharge    Consult Status Consult Status: Follow-up Date: 01/19/21 Follow-up type: In-patient    Matilde Sprang Asiya Cutbirth 01/19/2021, 9:49 AM

## 2021-01-19 NOTE — Lactation Note (Signed)
This note was copied from a baby's chart. Lactation Consultation Note  Patient Name: Cindy Hansen UXLKG'M Date: 01/19/2021 Reason for consult: Follow-up assessment;Term;Infant weight loss;Primapara;1st time breastfeeding Age:40 hours  LC went back for 2nd visit and baby latched, LC added pillow for support.  LC reviewed the importance of having the baby hug the breast to improve depth.  LC reviewed basics , see teaching and education below.  Both mom and dad receptive to teaching and questions were answered. Mom aware of the [facility] Fall resources after D/C .   Maternal Data Has patient been taught Hand Expression?: Yes Does the patient have breastfeeding experience prior to this delivery?: No  Feeding Mother's Current Feeding Choice: Breast Milk  LATCH Score Latch:  (latched with depth)  Audible Swallowing:  (increased swallows with depth)  Type of Nipple:  (nipple well rounded)  Comfort (Breast/Nipple):  (per mom comfortable)  Hold (Positioning):  (mom latched the baby with depth)      Lactation Tools Discussed/Used Tools: Pump;Flanges Flange Size: 24;27 Breast pump type: Manual Pump Education: Setup, frequency, and cleaning;Milk Storage Reason for Pumping: PRN  Interventions Interventions: Breast feeding basics reviewed;Breast massage;Breast compression;Support pillows;Hand pump  Discharge Discharge Education: Engorgement and breast care;Warning signs for feeding baby Pump: Personal;Manual  Consult Status Consult Status: Complete Date: 01/19/21 Follow-up type: In-patient    Cindy Hansen 01/19/2021, 11:35 AM

## 2021-01-19 NOTE — Discharge Summary (Signed)
Postpartum Discharge Summary  01/19/21      Patient Name: Cindy Hansen DOB: 01-16-1981 MRN: [id number]  Date of admission: 01/17/2021 Delivery date:01/17/2021  Delivering provider: Holli Humbles B  Date of discharge: 01/19/2021  Admitting diagnosis: Normal labor [O80, Z37.9] Intrauterine pregnancy: [redacted]w[redacted]d     Secondary diagnosis:  Active Problems:   Normal labor   HSV-2 infection   Cervical polyp Asthma  Additional problems: None    Discharge diagnosis: Term Pregnancy Delivered                                              Post partum procedures:None Augmentation: N/A Complications: None  Hospital course: Onset of Labor With Vaginal Delivery      40 y.o. yo G1P1001 at [redacted]w[redacted]d was admitted in Active Labor on 01/17/2021. Patient had an uncomplicated labor course as follows:  Membrane Rupture Time/Date: 7:14 AM ,01/17/2021   Delivery Method:Vaginal, Spontaneous  Episiotomy: None  Lacerations:  1st degree  Patient had an uncomplicated postpartum course.  She is ambulating, tolerating a regular diet, passing flatus, and urinating well. Patient is discharged home in stable condition on 01/19/21.  Newborn Data: Birth date:01/17/2021  Birth time:9:00 AM  Gender:Female  Living status:Living  Apgars:8 ,9  Weight:2790 g   Magnesium Sulfate received: No BMZ received: No Rhophylac:N/A MMR:N/A T-DaP:Given prenatally Flu: N/A Transfusion:No  Physical exam  Vitals:   01/18/21 0545 01/18/21 1408 01/18/21 2103 01/19/21 0651  BP: 110/85 112/84 118/82 (!) 113/55  Pulse: 63 62 74 (!) 57  Resp: $Remo'20 18 18 18  'KwOKI$ Temp: 98.2 F (36.8 C) 98.5 F (36.9 C) 98.2 F (36.8 C) 98.3 F (36.8 C)  TempSrc: Oral Oral Oral Oral  SpO2: 98% 100% 100% 97%   General: alert, cooperative and no distress  Cardio:  RRR Lungs:  CTAB Lochia: appropriate Uterine Fundus: firm Incision: N/A DVT Evaluation: No evidence of DVT seen on physical exam. No cords or calf tenderness. Labs: Lab  Results  Component Value Date   WBC 9.3 01/18/2021   HGB 10.2 (L) 01/18/2021   HCT 29.0 (L) 01/18/2021   MCV 93.5 01/18/2021   PLT 159 01/18/2021   No flowsheet data found. Edinburgh Score: Edinburgh Postnatal Depression Scale Screening Tool 01/18/2021  I have been able to laugh and see the funny side of things. 0  I have looked forward with enjoyment to things. 0  I have blamed myself unnecessarily when things went wrong. 0  I have been anxious or worried for no good reason. 1  I have felt scared or panicky for no good reason. 0  Things have been getting on top of me. 0  I have been so unhappy that I have had difficulty sleeping. 0  I have felt sad or miserable. 0  I have been so unhappy that I have been crying. 0  The thought of harming myself has occurred to me. 0  Edinburgh Postnatal Depression Scale Total 1      After visit meds:  Allergies as of 01/19/2021   No Known Allergies     Medication List    STOP taking these medications   famotidine 20 MG tablet Commonly known as: PEPCID     TAKE these medications   albuterol 108 (90 Base) MCG/ACT inhaler Commonly known as: VENTOLIN HFA INHALE 2 PUFFS INTO THE LUNGS EVERY 4 HOURS AS NEEDED FOR WHEEZING  OR SHORTNESS OF BREATH.   Breo Ellipta 200-25 MCG/INH Aepb Generic drug: fluticasone furoate-vilanterol TAKE 1 PUFF BY MOUTH EVERY DAY What changed:   how much to take  how to take this  when to take this   cetirizine 10 MG tablet Commonly known as: ZYRTEC TAKE 1 TABLET BY MOUTH EVERY DAY What changed:   when to take this  reasons to take this   ferrous sulfate 325 (65 FE) MG tablet Take 325 mg by mouth 2 (two) times daily with a meal.   ibuprofen 800 MG tablet Commonly known as: ADVIL Take 1 tablet (800 mg total) by mouth every 8 (eight) hours as needed.   prenatal multivitamin Tabs tablet Take 1 tablet by mouth daily at 12 noon.   valACYclovir 500 MG tablet Commonly known as: VALTREX Take 500 mg  by mouth 2 (two) times daily.        Discharge home in stable condition Infant Feeding: Breast Infant Disposition:home with mother Discharge instruction: per After Visit Summary and Postpartum booklet. Activity: Advance as tolerated. Pelvic rest for 6 weeks.  Diet: routine diet Anticipated Birth Control: None Postpartum Appointment:6 weeks Additional Postpartum F/U: None Future Appointments: Future Appointments  Date Time Provider [place]  07/15/2021  8:30 AM Valentina Shaggy, MD [facility] None   Follow up Visit:  Follow-up Information    Drema Dallas, DO Follow up in 6 week(s).   Specialty: Obstetrics and Gynecology Why: Please keep your 6 week postpartum visit. Contact information: [address] [place] [address] [phone]                   01/19/2021 Drema Dallas, DO

## 2021-01-25 ENCOUNTER — Inpatient Hospital Stay (HOSPITAL_COMMUNITY): Admit: 2021-01-25 | Payer: Self-pay

## 2021-06-25 ENCOUNTER — Other Ambulatory Visit: Payer: Self-pay | Admitting: Obstetrics and Gynecology

## 2021-06-25 DIAGNOSIS — Z1231 Encounter for screening mammogram for malignant neoplasm of breast: Secondary | ICD-10-CM

## 2021-07-05 ENCOUNTER — Ambulatory Visit
Admission: RE | Admit: 2021-07-05 | Discharge: 2021-07-05 | Disposition: A | Discharge status: Home / Self Care | Payer: BC Managed Care – PPO | Source: Ambulatory Visit | Attending: Obstetrics and Gynecology | Admitting: Obstetrics and Gynecology

## 2021-07-05 ENCOUNTER — Other Ambulatory Visit: Payer: Self-pay

## 2021-07-05 DIAGNOSIS — Z1231 Encounter for screening mammogram for malignant neoplasm of breast: Secondary | ICD-10-CM

## 2021-07-14 ENCOUNTER — Other Ambulatory Visit: Payer: Self-pay | Admitting: Allergy & Immunology

## 2021-07-15 ENCOUNTER — Ambulatory Visit: Payer: BC Managed Care – PPO | Admitting: Allergy & Immunology

## 2021-07-28 ENCOUNTER — Other Ambulatory Visit: Payer: Self-pay | Admitting: Allergy & Immunology

## 2021-08-10 ENCOUNTER — Other Ambulatory Visit: Payer: Self-pay | Admitting: Allergy & Immunology

## 2021-08-10 ENCOUNTER — Ambulatory Visit: Payer: BC Managed Care – PPO | Admitting: Allergy & Immunology

## 2021-08-13 ENCOUNTER — Other Ambulatory Visit: Payer: Self-pay | Admitting: Allergy & Immunology

## 2021-08-28 ENCOUNTER — Other Ambulatory Visit: Payer: Self-pay | Admitting: Allergy & Immunology

## 2021-08-31 ENCOUNTER — Ambulatory Visit: Payer: BC Managed Care – PPO | Admitting: Allergy & Immunology

## 2021-09-09 ENCOUNTER — Ambulatory Visit (INDEPENDENT_AMBULATORY_CARE_PROVIDER_SITE_OTHER): Payer: BC Managed Care – PPO | Admitting: Allergy & Immunology

## 2021-09-09 ENCOUNTER — Other Ambulatory Visit: Payer: Self-pay

## 2021-09-09 VITALS — BP 124/70 | HR 72 | Temp 98.0°F | Resp 18 | Ht 65.0 in | Wt 207.0 lb

## 2021-09-09 DIAGNOSIS — J4540 Moderate persistent asthma, uncomplicated: Secondary | ICD-10-CM | POA: Diagnosis not present

## 2021-09-09 DIAGNOSIS — T7800XD Anaphylactic reaction due to unspecified food, subsequent encounter: Secondary | ICD-10-CM | POA: Diagnosis not present

## 2021-09-09 DIAGNOSIS — J3089 Other allergic rhinitis: Secondary | ICD-10-CM | POA: Diagnosis not present

## 2021-09-09 NOTE — Patient Instructions (Addendum)
1. Moderate persistent asthma  - Lung function looked great today. - Decrease the Breo to the dose today to limit steroid exposure.  - Daily controller medication(s): Breo one puff once daily - Prior to physical activity: ProAir 2 puffs 10-15 minutes before physical activity. - Rescue medications: ProAir 4 puffs every 4-6 hours as needed - Asthma control goals:  * Full participation in all desired activities (may need albuterol before activity) * Albuterol use two time or less a week on average (not counting use with activity) * Cough interfering with sleep two time or less a month * Oral steroids no more than once a year * No hospitalizations  2. Adverse food reaction (cow's milk)  - Continue to avoid cow's milk. Audry Riles might be up to date.   3. Perennial allergic rhinitis (horse, mouse, dust mites, cat and dog) - Continue with Zyrtec (cetirizine) 10mg  tablet once daily as needed.   4. Return in about 6 months (around 03/09/2022).    Please inform 05/09/2022 of any Emergency Department visits, hospitalizations, or changes in symptoms. Call us before going to the ED for breathing or allergy symptoms since we might be able to fit you in for a sick visit. Feel free to contact us anytime with any questions, problems, or concerns.  It was a pleasure to see you again today!  Websites that have reliable patient information: 1. American Academy of Asthma, Allergy, and Immunology: www.aaaai.org 2. Food Allergy Research and Education (FARE): foodallergy.org 3. Mothers of Asthmatics: http://www.asthmacommunitynetwork.org 4. American College of Allergy, Asthma, and Immunology: www.acaai.org   COVID-19 Vaccine Information can be found at: [place] For questions related to vaccine distribution or appointments, please email [email or web address] or call [phone].   We realize that you might be concerned about having  an allergic reaction to the COVID19 vaccines. To help with that concern, WE ARE OFFERING THE COVID19 VACCINES IN OUR OFFICE! Ask the front desk for dates!     "Like" [phone] on Facebook and Instagram for our latest updates!      A healthy democracy works best when [place] participate! Make sure you are registered to vote! If you have moved or changed any of your contact information, you will need to get this updated before voting!  In some cases, you MAY be able to register to vote online: [employer]

## 2021-09-09 NOTE — Progress Notes (Signed)
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 Moderate persistent asthma  - Lung function looked great today. - Decrease the Breo to the dose today to limit steroid exposure.  - Daily controller medication(s): Breo one puff once daily - Prior to physical activity: ProAir 2 puffs 10-15 minutes before physical activity. - Rescue medications: ProAir 4 puffs every 4-6 hours as needed - Asthma control goals:  * Full participation in all desired activities (may need albuterol before activity) * Albuterol use two time or less a week on average (not counting use with activity) * Cough interfering with sleep two time or less a month * Oral steroids no more than once a year * No hospitalizations  2. Adverse food reaction (cow's milk)  - Continue to avoid cow's milk. Audry Riles might be up to date.   3. Perennial allergic rhinitis (horse, mouse, dust mites, cat and dog) - Continue with Zyrtec (cetirizine) 10mg  tablet once daily as needed.   4. Return in about 6 months (around 03/09/2022).    Subjective:   Cindy Hansen is a 40 y.o. female presenting today for follow up of  Chief Complaint  Patient presents with   Follow-up    Patient  in today for a follow up and has no complaints.    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  Cindy Hansen is a 40 y.o. female presenting for a follow up visit.  She was last seen in March 2022.  At that time, her lung function looked great.  We changed her to Breo 200 mcg 1 puff once daily in combination with montelukast 10 mg daily.  For her allergic rhinitis, would continue with Zyrtec once daily as needed.  She continue to avoid cows milk and her Auvi-Q is up-to-date.  Since last visit, she has done very well.  The whole family dressed up as little red riding her in for Halloween.  Dad was the April 2022 and [place] was [place].  Their 79-month-old was dressed up as little red riding hood as well, and she rode in a basket that 9-month carried.  It was a pretty adorable picture.  Asthma/Respiratory Symptom History: She is Breo once puff once daily. She dropped the montelukast a while ago. She is not having problems with working out. She was previously on Breo before her pregnancy, but we changed to something else since she was having issues. But she wanted to change back to the [facility]. She likes the once daily part of this dosing. She has not needed prednisone at all.  Allergic Rhinitis Symptom History: She just stays away from her triggers for her environmental allergens. She will take an allergy pill in environments where she will wear a mask. She is using cetirizine once daily.  She is not using a nose spray at all at this point.  She did use the fluticasone at one point when she had a cold like illness and it did help, but she has since stopped it.   Food Allergy Symptom History: She avoids cow's milk and tries to avoid milk products. She will occasionally eat an icing that might contain milk. She does not eat cheese or yogurt. Audry Riles might be up to date.   Otherwise, there have been no changes to her past medical history, surgical history, family history, or social  history.    Review of Systems  Constitutional: Negative.  Negative for chills, fever, malaise/fatigue and weight loss.  HENT:  Positive for congestion. Negative for ear discharge, ear pain and sinus pain.   Eyes:  Negative for pain, discharge and redness.  Respiratory:  Negative for cough, sputum production, shortness of breath and wheezing.   Cardiovascular: Negative.  Negative for chest pain and palpitations.  Gastrointestinal:  Negative for abdominal pain, constipation, diarrhea, heartburn, nausea and vomiting.  Skin: Negative.  Negative for itching and rash.  Neurological:  Negative for dizziness and headaches.  Endo/Heme/Allergies:  Positive for environmental allergies. Does not bruise/bleed easily.      Objective:   Blood pressure 124/70, pulse 72, temperature 98 F (36.7 C), temperature source Temporal, resp. rate 18, height 5\' 5"  (1.651 m), weight 207 lb (93.9 kg), SpO2 98 %, currently breastfeeding. Body mass index is 34.45 kg/m.   Physical Exam:  Physical Exam Vitals reviewed.  Constitutional:      Appearance: She is well-developed.     Comments: Smiling and delightful.  HENT:     Head: Normocephalic and atraumatic.     Right Ear: Tympanic membrane, ear canal and external ear normal.     Left Ear: Tympanic membrane, ear canal and external ear normal.     Nose: No nasal deformity, septal deviation, mucosal edema or rhinorrhea.     Right Turbinates: Enlarged and swollen.     Left Turbinates: Enlarged and swollen.     Right Sinus: No maxillary sinus tenderness or frontal sinus tenderness.     Left Sinus: No maxillary sinus tenderness or frontal sinus tenderness.     Mouth/Throat:     Mouth: Mucous membranes are not pale and not dry.     Pharynx: Uvula midline.  Eyes:     General: Lids are normal. Allergic shiner present.        Right eye: No discharge.        Left eye: No discharge.     Conjunctiva/sclera: Conjunctivae normal.     Right eye: Right conjunctiva  is not injected. No chemosis.    Left eye: Left conjunctiva is not injected. No chemosis.    Pupils: Pupils are equal, round, and reactive to light.  Cardiovascular:     Rate and Rhythm: Normal rate and regular rhythm.     Heart sounds: Normal heart sounds.  Pulmonary:     Effort: Pulmonary effort is normal. No tachypnea, accessory muscle usage or respiratory distress.     Breath sounds: Normal breath sounds. No wheezing, rhonchi or rales.     Comments: Moving air well in all lung fields. Chest:     Chest wall: No tenderness.  Lymphadenopathy:     Cervical: No cervical adenopathy.  Skin:    Coloration: Skin is not pale.     Findings: No abrasion, erythema, petechiae or rash. Rash is not papular, urticarial or vesicular.  Neurological:  Mental Status: She is alert.  Psychiatric:        Behavior: Behavior is cooperative.     Diagnostic studies:   Spirometry: results normal (FEV1: 1.89/71%, FVC: 2.70/83%, FEV1/FVC: 70%).    Spirometry consistent with normal pattern.   Allergy Studies: none        Malachi Bonds, MD  [facility]

## 2021-09-10 ENCOUNTER — Encounter: Payer: Self-pay | Admitting: Allergy & Immunology

## 2021-09-20 ENCOUNTER — Other Ambulatory Visit: Payer: Self-pay | Admitting: Allergy & Immunology

## 2022-01-05 ENCOUNTER — Other Ambulatory Visit: Payer: Self-pay | Admitting: Allergy & Immunology

## 2022-01-29 IMAGING — RF DG HYSTEROGRAM
1 series · 5 of 5 positions shown · IV contrast (omnipaque)
Comparison: None.

CLINICAL DATA: Infertility.

EXAM:
HYSTEROSALPINGOGRAM
TECHNIQUE: Following cleansing of the cervix and vagina with Betadine solution,
a hysterosalpingogram was performed using a 5-French
hysterosalpingogram catheter and Omnipaque 300 contrast. The patient
tolerated the examination without difficulty.

[Series 1: one shot · 5 of 5 slices shown]
[im 1/5]
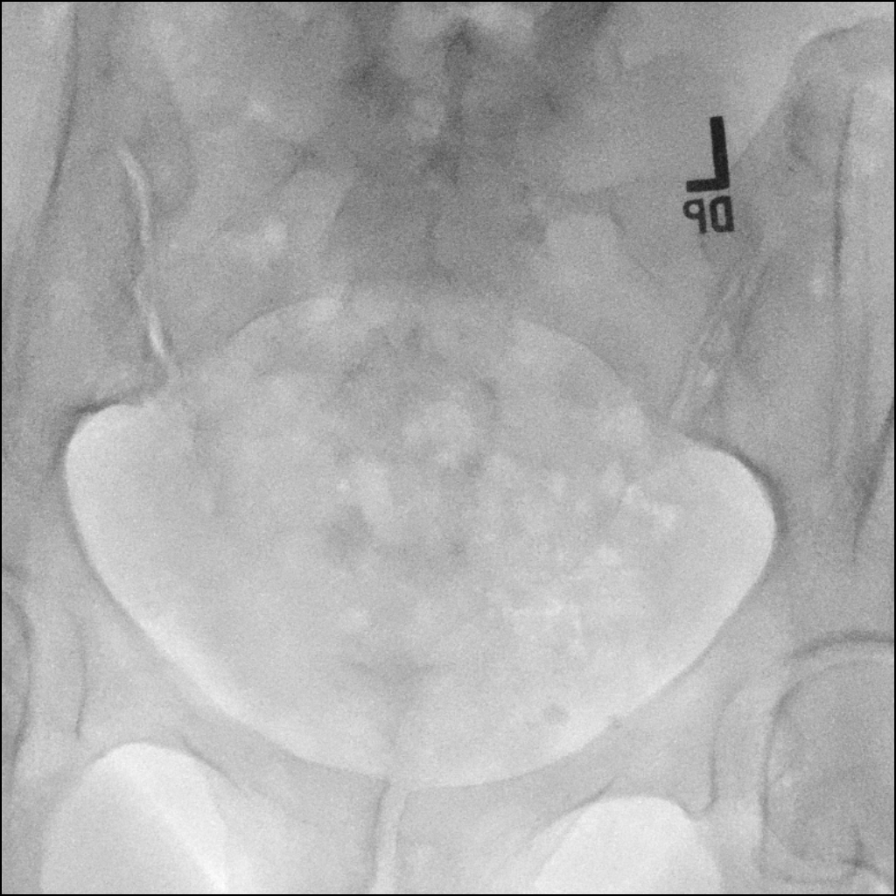
[im 2/5]
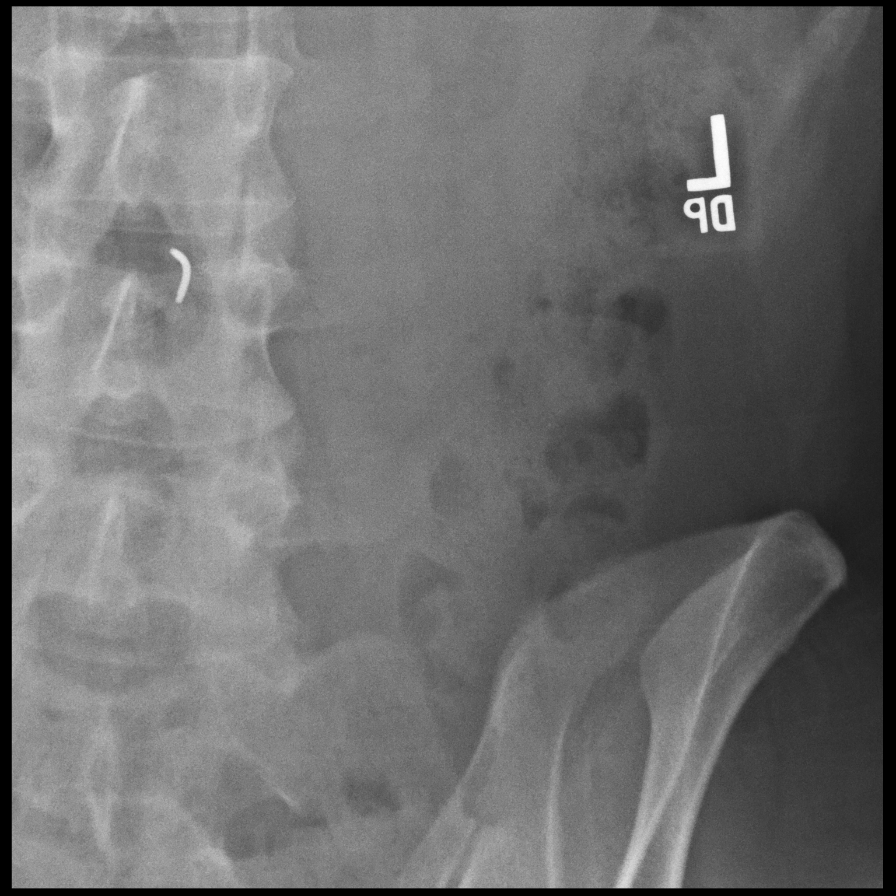
[im 3/5]
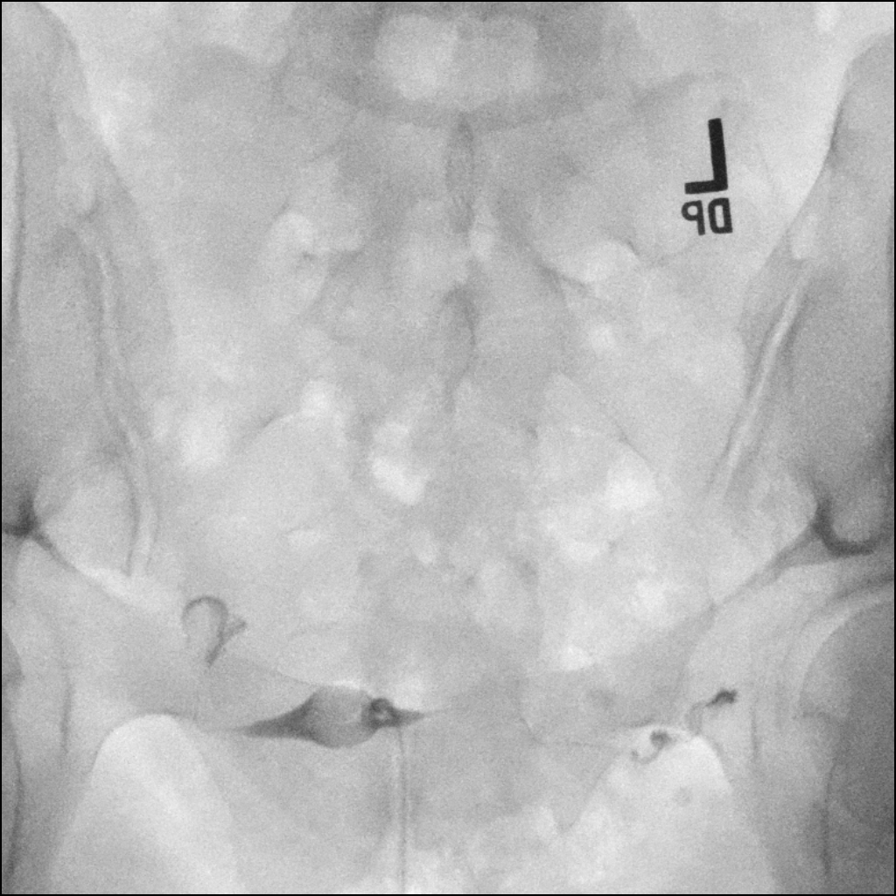
[im 4/5]
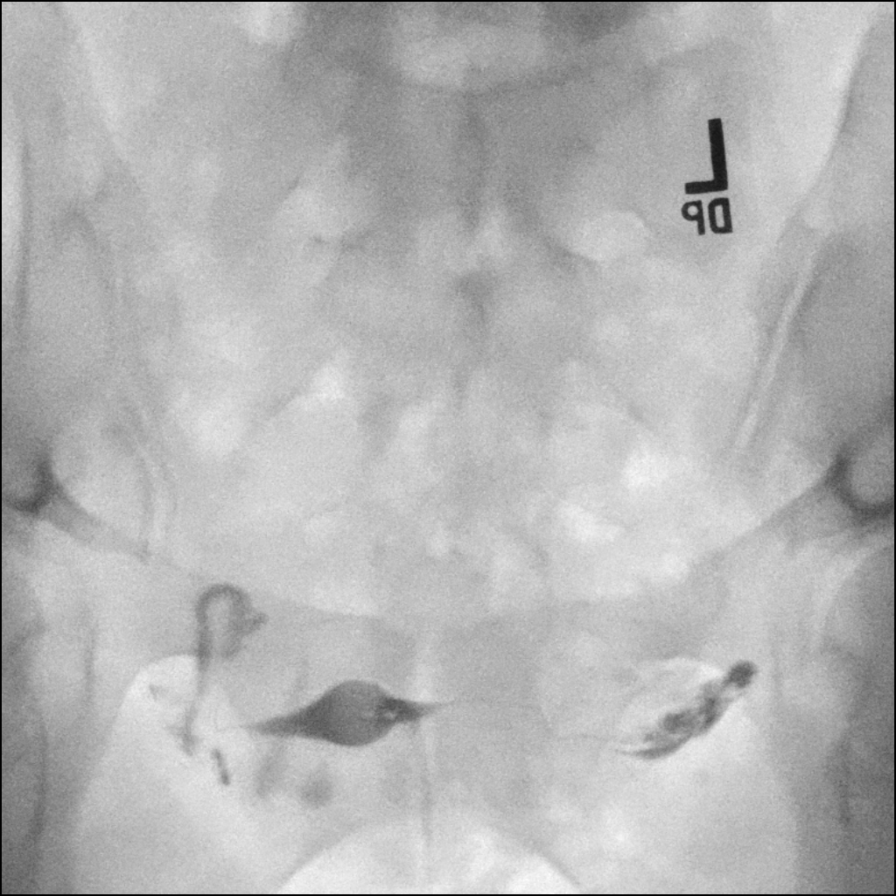
[im 5/5]
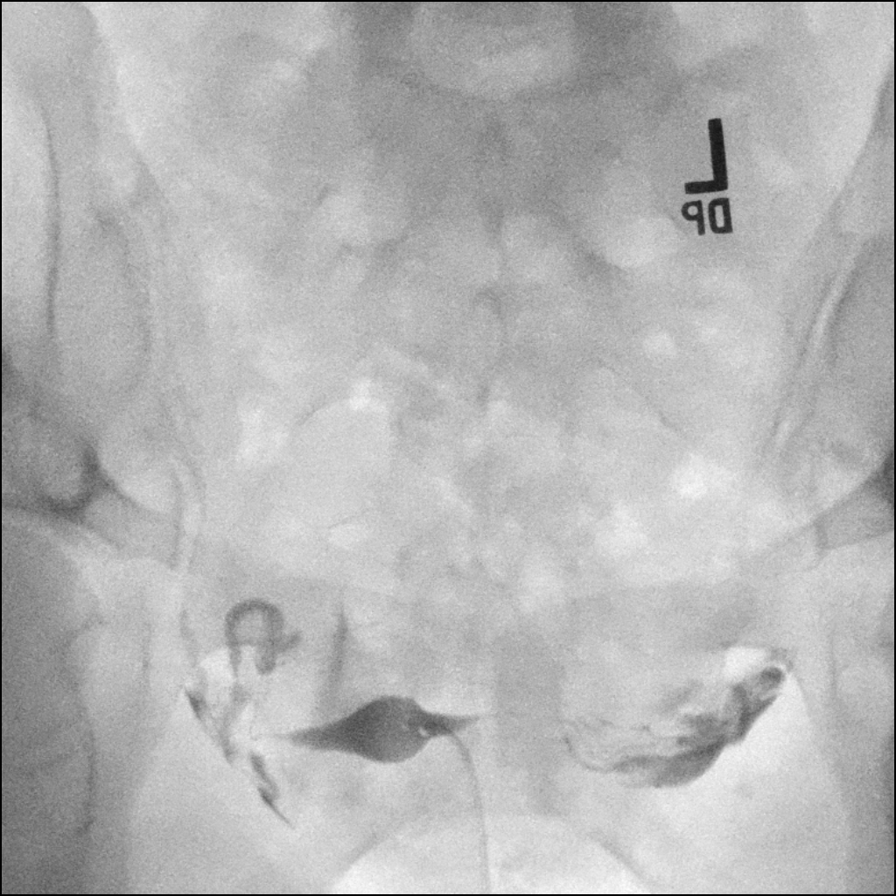

[5 of 5 positions shown; findings below may reference images not displayed]

FLUOROSCOPY TIME:  Radiation Exposure Index (as provided by the
fluoroscopic device): 24 seconds

If the device does not provide the exposure index:

Fluoroscopy Time:  93 mGy

Number of Acquired Images:  1
FINDINGS: The endometrial cavity is normal in appearance and contour. No signs
of mullerian duct anomaly.

Opacification of both fallopian tubes is seen. Both tubes appear
normal. Intraperitoneal spill of contrast from both fallopian tubes
is demonstrated.
IMPRESSION: Normal study. Both fallopian tubes are patent.

## 2022-03-10 ENCOUNTER — Ambulatory Visit: Payer: BC Managed Care – PPO | Admitting: Allergy & Immunology

## 2022-03-10 ENCOUNTER — Encounter: Payer: Self-pay | Admitting: Allergy & Immunology

## 2022-03-10 VITALS — BP 138/70 | HR 65 | Temp 97.2°F | Resp 16 | Ht 65.0 in | Wt 205.2 lb

## 2022-03-10 DIAGNOSIS — T7800XD Anaphylactic reaction due to unspecified food, subsequent encounter: Secondary | ICD-10-CM | POA: Diagnosis not present

## 2022-03-10 DIAGNOSIS — J0100 Acute maxillary sinusitis, unspecified: Secondary | ICD-10-CM

## 2022-03-10 DIAGNOSIS — J4540 Moderate persistent asthma, uncomplicated: Secondary | ICD-10-CM

## 2022-03-10 DIAGNOSIS — J3089 Other allergic rhinitis: Secondary | ICD-10-CM | POA: Diagnosis not present

## 2022-03-10 MED ORDER — AMOXICILLIN-POT CLAVULANATE 875-125 MG PO TABS: 1.0000 | ORAL_TABLET | Freq: Two times a day (BID) | ORAL | 0 refills | Status: AC

## 2022-03-10 MED ORDER — EPINEPHRINE 0.3 MG/0.3ML IJ SOAJ: 0.3000 mg | INTRAMUSCULAR | 1 refills | Status: DC | PRN

## 2022-03-10 MED ORDER — LEVOCETIRIZINE DIHYDROCHLORIDE 5 MG PO TABS: 5.0000 mg | ORAL_TABLET | Freq: Every evening | ORAL | 2 refills | Status: DC

## 2022-03-10 NOTE — Patient Instructions (Addendum)
1. Moderate persistent asthma  ?- Lung function looked great today. ?- We are not going to make any medication changes.  ?- Daily controller medication(s): Breo one puff once daily ?- Prior to physical activity: ProAir 2 puffs 10-15 minutes before physical activity. ?- Rescue medications: ProAir 4 puffs every 4-6 hours as needed ?- Asthma control goals:  ?* Full participation in all desired activities (may need albuterol before activity) ?* Albuterol use two time or less a week on average (not counting use with activity) ?* Cough interfering with sleep two time or less a month ?* Oral steroids no more than once a year ?* No hospitalizations ? ?2. Adverse food reaction (cow's milk)  ?- Continue to avoid cow's milk. ?- AuviQ might be up to date.  ? ?3. Perennial allergic rhinitis (horse, mouse, dust mites, cat and dog) ?- Start Xyzal 5mg  daily (let know if insurance does not cover this and we can try something else).  ? ?4. Acute sinusitis ?- With your current symptoms and time course, antibiotics are needed: Augmentin 875mg  twice daily for 10 days ?- Add on nasal saline spray (i.e., Simply Saline) or nasal saline lavage (i.e., NeilMed) as needed prior to medicated nasal sprays. ?- For thick post nasal drainage, add guaifenesin [phone] mg (Mucinex) twice daily as needed for mucous thinning with adequate hydration to help it work.  ?- Call us with an update next week.  ? ?4. Return in about 6 months (around 09/10/2022).  ? ? ?Please inform us of any Emergency Department visits, hospitalizations, or changes in symptoms. Call 13/02/2022 before going to the ED for breathing or allergy symptoms since we might be able to fit you in for a sick visit. Feel free to contact us anytime with any questions, problems, or concerns. ? ?It was a pleasure to see you again today! ? ?Websites that have reliable patient information: ?1. American Academy of Asthma, Allergy, and Immunology: www.aaaai.org ?2. Food Allergy Research and  Education (FARE): foodallergy.org ?3. Mothers of Asthmatics: http://www.asthmacommunitynetwork.org ?4. [place] of Allergy, Asthma, and Immunology: [place] ? ? ?COVID-19 Vaccine Information can be found at: [employer] For questions related to vaccine distribution or appointments, please email [email or web address] or call [phone].  ? ?We realize that you might be concerned about having an allergic reaction to the COVID19 vaccines. To help with that concern, WE ARE OFFERING THE COVID19 VACCINES IN OUR OFFICE! Ask the front desk for dates!  ? ? ? ??Like? [email or web address] on Facebook and Instagram for our latest updates!  ?  ? ? ?A healthy democracy works best when [phone] participate! Make sure you are registered to vote! If you have moved or changed any of your contact information, you will need to get this updated before voting! ? ?In some cases, you MAY be able to register to vote online: [place] ? ? ? ? ?

## 2022-03-10 NOTE — Progress Notes (Signed)
? ?FOLLOW UP ? ?Date of Service/Encounter:  03/10/22 ? ? ?Assessment:  ? ?Moderate persistent asthma, uncomplicated  ?  ?Asthma complicating pregnancy ?  ?Adverse reaction to Trelegy (throat burning) ?  ?Perennial allergic rhinitis (horse, mouse, mold, dust mites, cat and dog) ?  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 ?Plan/Recommendations:  ? ?1. Moderate persistent asthma  ?- Lung function looked great today. ?- We are not going to make any medication changes.  ?- Daily controller medication(s): Breo 100mcg one puff once daily ?- Prior to physical activity: ProAir 2 puffs 10-15 minutes before physical activity. ?- Rescue medications: ProAir 4 puffs every 4-6 hours as needed ?- Asthma control goals:  ?* Full participation in all desired activities (may need albuterol before activity) ?* Albuterol use two time or less a week on average (not counting use with activity) ?* Cough interfering with sleep two time or less a month ?* Oral steroids no more than once a year ?* No hospitalizations ? ?2. Adverse food reaction (cow's milk)  ?- Continue to avoid cow's milk. ?- AuviQ might be up to date.  ? ?3. Perennial allergic rhinitis (horse, mouse, dust mites, cat and dog) ?- Start Xyzal 5mg  daily (let us know if insurance does not cover this and we can try something else).  ? ?4. Acute sinusitis ?- With your current symptoms and time course, antibiotics are needed: Augmentin 875mg  twice daily for 10 days ?- Add on nasal saline spray (i.e., Simply Saline) or nasal saline lavage (i.e., NeilMed) as needed prior to medicated nasal sprays. ?- For thick post nasal drainage, add guaifenesin [phone] mg (Mucinex) twice daily as needed for mucous thinning with adequate hydration to help it work.  ?- Call us with an update next week.  ? ?4. Return in about 6 months (around 09/10/2022).  ? ? ? ?Subjective:  ? ?Cindy Hansen is a 41 y.o. female presenting today for follow up of  ?Chief Complaint  ?Patient  presents with  ? Asthma  ?  6 mth f/u - Fine  ? Perennial Allergi Rhinitis  ?  6 mth f/u - Sinus struggle; Patient states Sxs: cough - yellowish green, sinus drainage x 2 wk   ? Food Allergy  ?  6 mth f/u - Patient states she has avoided all food allergen  ? ? ?Cindy Hansen has a history of the following: ?Patient Active Problem List  ? Diagnosis Date Noted  ? Normal labor 01/17/2021  ? 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 Cervical polyp 01/17/2021  ? Perennial allergic rhinitis 11/01/2020  ? Moderate persistent asthma, uncomplicated 11/01/2020  ? Anaphylactic shock due to adverse food reaction 11/01/2020  ? Restrictive lung disease 10/25/2017  ? Adverse food reaction 10/25/2017  ? ? ?History obtained from: chart review and patient. ? ?Cindy Hansen is a 41 y.o. female presenting for a follow up visit.  She was last seen in November 2022.  At that time, her lung function looked great.  We decreased her Breo from 200 to 100 mcg.  For her cows milk allergy, we recommended continued avoidance.  Epinephrine autoinjector was up-to-date.  Allergic rhinitis was controlled with Zyrtec 10 mg once daily. ? ?Since last visit, she has largely done well. ? ?Asthma/Respiratory Symptom History: She has been on the lower dose Breo and she has done fine with that. She pays $30 for it, which is affordable for her.  She has not had asthma attacks since the last visit. She has not used it  in months or more. ? ?Allergic Rhinitis Symptom History: She has been having two weeks of sinus pressure. She has not had a fever.  This year was worse for her symptoms due to decreased mask use and that piece of everything. This tends to reflect how the pollen is. She reports a lot of pressure and whatnot based on what the weather is doing. She does not use a nose spray routinely. She uses cetirizine intermittently. She now just takes an antihistamine as needed. Once she was pregnant, her allergy issues even improved. Over the last two weeks,  she has been using some OTC sinus congestion as well as Vicks (NOT the nose spray). She has been having a productive cough during the last two weeks.  ? ?They did a little party at her youngest's daycare for her birthday.  Her other children are out of the house already.  ? ?Otherwise, there have been no changes to her past medical history, surgical history, family history, or social history. ? ? ? ?Review of Systems  ?Constitutional: Negative.  Negative for fever, malaise/fatigue and weight loss.  ?HENT:  Positive for congestion and sinus pain. Negative for ear discharge and ear pain.   ?Eyes:  Negative for pain, discharge and redness.  ?Respiratory:  Positive for cough. Negative for sputum production, shortness of breath and wheezing.   ?Cardiovascular: Negative.  Negative for chest pain and palpitations.  ?Gastrointestinal:  Negative for abdominal pain, heartburn, nausea and vomiting.  ?Skin: Negative.  Negative for itching and rash.  ?Neurological:  Negative for dizziness and headaches.  ?Endo/Heme/Allergies:  Negative for environmental allergies. Does not bruise/bleed easily.   ? ? ? ?Objective:  ? ?Blood pressure 138/70, pulse 65, temperature (!) 97.2 ?F (36.2 ?C), resp. rate 16, height 5\' 5"  (1.651 m), weight 205 lb 3.2 oz (93.1 kg), SpO2 97 %, currently breastfeeding. ?Body mass index is 34.15 kg/m?. ? ? ? ?Physical Exam ?Vitals reviewed.  ?Constitutional:   ?   Appearance: She is well-developed.  ?   Comments: Smiling and delightful.  ?HENT:  ?   Head: Normocephalic and atraumatic.  ?   Right Ear: Tympanic membrane, ear canal and external ear normal.  ?   Left Ear: Tympanic membrane, ear canal and external ear normal.  ?   Nose: No nasal deformity, septal deviation, mucosal edema or rhinorrhea.  ?   Right Turbinates: Enlarged, swollen and pale.  ?   Left Turbinates: Enlarged, swollen and pale.  ?   Right Sinus: Maxillary sinus tenderness present. No frontal sinus tenderness.  ?   Left Sinus: Maxillary sinus  tenderness present. No frontal sinus tenderness.  ?   Mouth/Throat:  ?   Mouth: Mucous membranes are not pale and not dry.  ?   Pharynx: Uvula midline.  ?Eyes:  ?   General: Lids are normal. Allergic shiner present.     ?   Right eye: No discharge.     ?   Left eye: No discharge.  ?   Conjunctiva/sclera: Conjunctivae normal.  ?   Right eye: Right conjunctiva is not injected. No chemosis. ?   Left eye: Left conjunctiva is not injected. No chemosis. ?   Pupils: Pupils are equal, round, and reactive to light.  ?Cardiovascular:  ?   Rate and Rhythm: Normal rate and regular rhythm.  ?   Heart sounds: Normal heart sounds.  ?Pulmonary:  ?   Effort: Pulmonary effort is normal. No tachypnea, accessory muscle usage or respiratory distress.  ?  Breath sounds: Normal breath sounds. No wheezing, rhonchi or rales.  ?   Comments: Moving air well in all lung fields. ?Chest:  ?   Chest wall: No tenderness.  ?Lymphadenopathy:  ?   Cervical: No cervical adenopathy.  ?Skin: ?   Coloration: Skin is not pale.  ?   Findings: No abrasion, erythema, petechiae or rash. Rash is not papular, urticarial or vesicular.  ?Neurological:  ?   Mental Status: She is alert.  ?Psychiatric:     ?   Behavior: Behavior is cooperative.  ?  ?  ?Diagnostic studies:  ? ?Spirometry: results normal (FEV1: 1.82/68%, FVC: 2.70/83%, FEV1/FVC: 67%).  ?  ?Spirometry consistent with mild obstructive disease.  ? ?Allergy Studies: none ? ? ? ? ? ?  ?Malachi Bonds, MD  ?[facility] ? ? ? ? ? ? ?

## 2022-05-29 ENCOUNTER — Other Ambulatory Visit: Payer: Self-pay | Admitting: Allergy & Immunology

## 2022-06-06 ENCOUNTER — Other Ambulatory Visit: Payer: Self-pay | Admitting: Obstetrics and Gynecology

## 2022-06-06 DIAGNOSIS — Z1231 Encounter for screening mammogram for malignant neoplasm of breast: Secondary | ICD-10-CM

## 2022-06-10 ENCOUNTER — Ambulatory Visit
Admission: RE | Admit: 2022-06-10 | Discharge: 2022-06-10 | Disposition: A | Discharge status: Home / Self Care | Payer: BC Managed Care – PPO | Source: Ambulatory Visit | Attending: Obstetrics and Gynecology | Admitting: Obstetrics and Gynecology

## 2022-06-10 DIAGNOSIS — Z1231 Encounter for screening mammogram for malignant neoplasm of breast: Secondary | ICD-10-CM

## 2022-09-15 ENCOUNTER — Ambulatory Visit: Payer: BC Managed Care – PPO | Admitting: Allergy & Immunology

## 2023-02-08 ENCOUNTER — Other Ambulatory Visit: Payer: Self-pay | Admitting: *Deleted

## 2023-02-08 MED ORDER — BREO ELLIPTA 200-25 MCG/ACT IN AEPB: INHALATION_SPRAY | RESPIRATORY_TRACT | 1 refills | Status: DC

## 2023-02-14 ENCOUNTER — Ambulatory Visit: Payer: BC Managed Care – PPO | Admitting: Allergy & Immunology

## 2023-03-16 ENCOUNTER — Encounter: Payer: Self-pay | Admitting: Allergy & Immunology

## 2023-03-16 ENCOUNTER — Other Ambulatory Visit: Payer: Self-pay

## 2023-03-16 ENCOUNTER — Ambulatory Visit: Payer: BC Managed Care – PPO | Admitting: Allergy & Immunology

## 2023-03-16 VITALS — BP 116/74 | HR 72 | Temp 98.0°F | Ht 65.0 in | Wt 232.5 lb

## 2023-03-16 DIAGNOSIS — T7800XD Anaphylactic reaction due to unspecified food, subsequent encounter: Secondary | ICD-10-CM

## 2023-03-16 DIAGNOSIS — J3089 Other allergic rhinitis: Secondary | ICD-10-CM

## 2023-03-16 DIAGNOSIS — J4540 Moderate persistent asthma, uncomplicated: Secondary | ICD-10-CM

## 2023-03-16 MED ORDER — ALBUTEROL SULFATE HFA 108 (90 BASE) MCG/ACT IN AERS: 2.0000 | INHALATION_SPRAY | RESPIRATORY_TRACT | 1 refills | Status: DC | PRN

## 2023-03-16 MED ORDER — PREDNISONE 10 MG PO TABS: ORAL_TABLET | ORAL | 0 refills | Status: DC

## 2023-03-16 MED ORDER — BREO ELLIPTA 200-25 MCG/ACT IN AEPB
1.0000 | INHALATION_SPRAY | Freq: Every day | RESPIRATORY_TRACT | 5 refills | Status: AC
Start: 2023-03-16 — End: 2023-04-15

## 2023-03-16 MED ORDER — RYALTRIS 665-25 MCG/ACT NA SUSP: 1.0000 | Freq: Two times a day (BID) | NASAL | 5 refills | Status: DC

## 2023-03-16 MED ORDER — LEVOCETIRIZINE DIHYDROCHLORIDE 5 MG PO TABS: 5.0000 mg | ORAL_TABLET | Freq: Every evening | ORAL | 2 refills | Status: DC

## 2023-03-16 NOTE — Progress Notes (Signed)
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 Moderate persistent asthma  - Lung function looked much better today. - We are not going to make any medication changes today.  - Daily controller medication(s): Breo 200 one puff once daily - Prior to physical activity: albuterol 2 puffs 10-15 minutes before physical activity. - Rescue medications: albuterol 4 puffs every 4-6 hours as needed - Asthma control goals:  * Full participation in all desired activities (may need albuterol before activity) * Albuterol use two time or less a week on average (not counting use with activity) * Cough interfering with sleep two time or less a month * Oral steroids no more than once a year * No hospitalizations  2. Adverse food reaction (cow's milk)  - Continue to avoid cow's milk. Cindy Hansen might be up to date.   3. Perennial allergic rhinitis (horse, mouse, dust mites, cat and dog) - Continue with Xyzal 5mg  daily. - We are sending in Ryaltris to use one spray per nostril twice daily. - We are starting you on a prednisone burst to get you through this terrible allergy season. - Consider allergy shots for long term control.   4. Return in about 6 months (around 09/16/2023). Make an appointment for your daughter as well!    Subjective:   Cindy Hansen is a 42 y.o. 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  Cindy Hansen is a 42 y.o. female presenting for a follow up visit. She was last seen in May 2023. At that time, lung functions was normal. WE continued with Breo one puff once daily. She continued to avoid cow's milk. EpiPen was up to date. We started levocetirizine 5 mg daily. We also treated her with Augmentin for 10 days.   Since the last visit, she has done well.   Asthma/Respiratory Symptom History: She did run out of her Virgel Bouquet and she felt bad when she was not getting it. It was definitely better once it was filled. Virgel Bouquet is $30 a month.  She has been on Trelegy but this caused some throat burning. She has not felt that she needed to use her rescue inhaler. She has not needed prednisone at all for her symptoms since the last time we saw her. She is very responsive to prednisone.   Allergic Rhinitis Symptom History: She remains on the Xyzal 1-2 times a day.  This seems to be working well for the most part.  However, she has had a few weeks of worsening sinus pain and pressure as well as sneezing and  eye watering.  She has been doubling up on her Xyzal without much relief.  She is interested in starting a new nasal spray.  She denies any fevers.  She has not done COVID testing.  There are no sick contacts.  Food Allergy Symptom History: She continues to avoid cow's milk. She does tolerate baked milk without a problem. She tells me that there are some things that are more decadent with more milk content that she cannot tolerate.   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  Otherwise, there have been no changes to her past medical history, surgical history, family history, or social history.    Review of Systems  Constitutional: Negative.  Negative for fever, malaise/fatigue and weight loss.  HENT:  Positive for congestion and sinus pain. Negative for ear discharge and ear pain.   Eyes:  Negative for pain, discharge and redness.  Respiratory:  Positive for cough. Negative for sputum production, shortness of breath and wheezing.   Cardiovascular: Negative.  Negative for chest pain and palpitations.  Gastrointestinal:  Negative for abdominal pain, heartburn, nausea and vomiting.  Skin: Negative.  Negative for itching and rash.  Neurological:  Negative for dizziness and headaches.  Endo/Heme/Allergies:  Negative for environmental allergies. Does not bruise/bleed easily.       Objective:   Blood pressure 116/74, pulse 72, temperature 98 F (36.7 C), temperature source Temporal, weight 232 lb 8 oz (105.5 kg), SpO2 98 %, currently breastfeeding. Body mass index is 38.69 kg/m.    Physical Exam Vitals reviewed.  Constitutional:      Appearance: She is well-developed.     Comments: Smiling and delightful.  HENT:     Head: Normocephalic and atraumatic.     Right Ear: Tympanic membrane, ear canal and external ear normal.     Left Ear: Tympanic membrane, ear canal and external ear normal.     Nose: No nasal deformity, septal deviation, mucosal edema or rhinorrhea.     Right Turbinates: Enlarged, swollen and pale.     Left Turbinates: Enlarged, swollen and pale.     Right Sinus: Maxillary sinus tenderness present. No frontal sinus tenderness.     Left Sinus: Maxillary sinus tenderness present. No frontal sinus tenderness.     Comments: No nasal polyps.    Mouth/Throat:     Mouth: Mucous membranes are not pale and not dry.     Pharynx: Uvula midline.  Eyes:     General: Lids are normal. Allergic shiner present.        Right  eye: No discharge.        Left eye: No discharge.     Conjunctiva/sclera: Conjunctivae normal.     Right eye: Right conjunctiva is not injected. No chemosis.    Left eye: Left conjunctiva is not injected. No chemosis.    Pupils: Pupils are equal, round, and reactive to light.  Cardiovascular:     Rate and Rhythm: Normal rate and regular rhythm.     Heart sounds: Normal heart sounds.  Pulmonary:     Effort: Pulmonary effort is normal. No tachypnea, accessory muscle usage or respiratory distress.     Breath sounds: Normal breath sounds. No wheezing, rhonchi or rales.     Comments: Moving air well in all lung fields. Chest:     Chest wall: No tenderness.  Lymphadenopathy:     Cervical: No  cervical adenopathy.  Skin:    Coloration: Skin is not pale.     Findings: No abrasion, erythema, petechiae or rash. Rash is not papular, urticarial or vesicular.  Neurological:     Mental Status: She is alert.  Psychiatric:        Behavior: Behavior is cooperative.      Diagnostic studies:    Spirometry: results abnormal (FEV1: 1.94/73%, FVC: 2.92/90%, FEV1/FVC: 66%).    Spirometry consistent with moderate obstructive disease.  This is better than it was the last time we saw her.  Allergy Studies: none        Malachi Bonds, MD  [facility]

## 2023-03-16 NOTE — Patient Instructions (Addendum)
1. Moderate persistent asthma  - Lung function looked much better today. - We are not going to make any medication changes today.  - Daily controller medication(s): Breo 200 one puff once daily - Prior to physical activity: albuterol 2 puffs 10-15 minutes before physical activity. - Rescue medications: albuterol 4 puffs every 4-6 hours as needed - Asthma control goals:  * Full participation in all desired activities (may need albuterol before activity) * Albuterol use two time or less a week on average (not counting use with activity) * Cough interfering with sleep two time or less a month * Oral steroids no more than once a year * No hospitalizations  2. Adverse food reaction (cow's milk)  - Continue to avoid cow's milk. Audry Riles might be up to date.   3. Perennial allergic rhinitis (horse, mouse, dust mites, cat and dog) - Continue with Xyzal 5mg  daily. - We are sending in Ryaltris to use one spray per nostril twice daily. - We are starting you on a prednisone burst to get you through this terrible allergy season. - Consider allergy shots for long term control.   4. Return in about 6 months (around 09/16/2023). Make an appointment for your daughter as well!    Please inform us of any Emergency Department visits, hospitalizations, or changes in symptoms. Call us before going to the ED for breathing or allergy symptoms since we might be able to fit you in for a sick visit. Feel free to contact us anytime with any questions, problems, or concerns.  It was a pleasure to see you again today!  Websites that have reliable patient information: 1. American Academy of Asthma, Allergy, and Immunology: www.aaaai.org 2. Food Allergy Research and Education (FARE): foodallergy.org 3. Mothers of Asthmatics: http://www.asthmacommunitynetwork.org 4. American College of Allergy, Asthma, and Immunology: www.acaai.org   COVID-19 Vaccine Information can be found at:  [email or web address].[email or web address] For questions related to vaccine distribution or appointments, please email [email or web address] or call [phone].   We realize that you might be concerned about having an allergic reaction to the COVID19 vaccines. To help with that concern, WE ARE OFFERING THE COVID19 VACCINES IN OUR OFFICE! Ask the front desk for dates!     "Like" [place] on Facebook and Instagram for our latest updates!      A healthy democracy works best when [employer] participate! Make sure you are registered to vote! If you have moved or changed any of your contact information, you will need to get this updated before voting!  In some cases, you MAY be able to register to vote online: [email or web address]

## 2023-04-28 ENCOUNTER — Other Ambulatory Visit: Payer: Self-pay | Admitting: Obstetrics and Gynecology

## 2023-04-28 DIAGNOSIS — Z1231 Encounter for screening mammogram for malignant neoplasm of breast: Secondary | ICD-10-CM

## 2023-05-10 ENCOUNTER — Other Ambulatory Visit: Payer: Self-pay | Admitting: Allergy & Immunology

## 2023-06-19 ENCOUNTER — Ambulatory Visit
Admission: RE | Admit: 2023-06-19 | Discharge: 2023-06-19 | Disposition: A | Discharge status: Home / Self Care | Payer: BC Managed Care – PPO | Source: Ambulatory Visit | Attending: Obstetrics and Gynecology | Admitting: Obstetrics and Gynecology

## 2023-06-19 DIAGNOSIS — Z1231 Encounter for screening mammogram for malignant neoplasm of breast: Secondary | ICD-10-CM

## 2023-06-21 ENCOUNTER — Other Ambulatory Visit: Payer: Self-pay | Admitting: Obstetrics and Gynecology

## 2023-06-21 DIAGNOSIS — R928 Other abnormal and inconclusive findings on diagnostic imaging of breast: Secondary | ICD-10-CM

## 2023-07-03 ENCOUNTER — Ambulatory Visit
Admission: RE | Admit: 2023-07-03 | Discharge: 2023-07-03 | Disposition: A | Discharge status: Home / Self Care | Payer: BC Managed Care – PPO | Source: Ambulatory Visit | Attending: Obstetrics and Gynecology | Admitting: Obstetrics and Gynecology

## 2023-07-03 DIAGNOSIS — R928 Other abnormal and inconclusive findings on diagnostic imaging of breast: Secondary | ICD-10-CM

## 2023-07-12 ENCOUNTER — Encounter: Payer: Self-pay | Admitting: Podiatry

## 2023-07-12 ENCOUNTER — Ambulatory Visit (INDEPENDENT_AMBULATORY_CARE_PROVIDER_SITE_OTHER): Payer: BC Managed Care – PPO

## 2023-07-12 ENCOUNTER — Ambulatory Visit: Payer: BC Managed Care – PPO | Admitting: Podiatry

## 2023-07-12 VITALS — BP 132/74

## 2023-07-12 DIAGNOSIS — M84375A Stress fracture, left foot, initial encounter for fracture: Secondary | ICD-10-CM | POA: Diagnosis not present

## 2023-07-12 DIAGNOSIS — M722 Plantar fascial fibromatosis: Secondary | ICD-10-CM

## 2023-07-12 NOTE — Progress Notes (Signed)
  Subjective:  Patient ID: Cindy Hansen, female    DOB: [birth year],   MRN: [id number]  Chief Complaint  Patient presents with   Foot Pain    NP/PAIN AND SWELLING IN LEFT FOOT    42 y.o. female presents for concern of swelling and pain in her left foot that has been going on for about a month. Recently increased her activity and noticing pain when going up and down stairs and certain movements on the top of her foot. Relates it has been swollen as well. Does have a history of right achilles tendonitis as well. Denies any current treatments. . Denies any other pedal complaints. Denies n/v/f/c.   Past Medical History:  Diagnosis Date   Asthma    Eczema    Prediabetes 03/2020    Objective:  Physical Exam: Vascular: DP/PT pulses 2/4 bilateral. CFT <3 seconds. Normal hair growth on digits. No edema.  Skin. No lacerations or abrasions bilateral feet.  Musculoskeletal: MMT 5/5 bilateral lower extremities in DF, PF, Inversion and Eversion. Deceased ROM in DF of ankle joint. Tender to the dorsum of the left foot most concentrated over the diaphysis of the fourth metatarsal. Some pain over second and third metatarsals as well. Pain with vibration over the fourth metatarsal. No pain with ROM of the lesser digits and no pain plantarly.  Neurological: Sensation intact to light touch.   Assessment:   1. Stress reaction of left foot, initial encounter      Plan:  Patient was evaluated and treated and all questions answered. X-rays reviewed and discussed with patient. No acute fractures or dislocations noted. Mild pes planus and collapse of the medial arch noted.  -Discussed treatement options for stress reaction; risks, alternatives, and benefits explained.  -Dispensed CAM boot Patient to wear at all times and instructed on use -Recommend protection, rest, ice, elevation daily until symptoms improve -Ant-inflammatories as needed -Patient to return to office in 4 weeks for serial  x-rays to assess healing  or sooner if condition worsens.    Louann Sjogren, DPM

## 2023-07-12 NOTE — Patient Instructions (Signed)

## 2023-07-16 ENCOUNTER — Other Ambulatory Visit: Payer: Self-pay | Admitting: Allergy & Immunology

## 2023-08-09 ENCOUNTER — Encounter: Payer: Self-pay | Admitting: Podiatry

## 2023-08-09 ENCOUNTER — Ambulatory Visit (INDEPENDENT_AMBULATORY_CARE_PROVIDER_SITE_OTHER): Payer: BC Managed Care – PPO | Admitting: Podiatry

## 2023-08-09 ENCOUNTER — Ambulatory Visit (INDEPENDENT_AMBULATORY_CARE_PROVIDER_SITE_OTHER): Payer: BC Managed Care – PPO

## 2023-08-09 DIAGNOSIS — M84375A Stress fracture, left foot, initial encounter for fracture: Secondary | ICD-10-CM

## 2023-08-09 NOTE — Progress Notes (Signed)
  Subjective:  Patient ID: Cindy Hansen, female    DOB: [birth year],   MRN: [id number]  Chief Complaint  Patient presents with   Foot Pain    Pt presents today for a foot pain follow up.    42 y.o. female presents for follow-up of left foot stress fracture. Relates she has been doing well and has tried somerime out of the boot and done ok. Has continued to rest mostly as well. Denies any current treatments. . Denies any other pedal complaints. Denies n/v/f/c.   Past Medical History:  Diagnosis Date   Asthma    Eczema    Prediabetes 03/2020    Objective:  Physical Exam: Vascular: DP/PT pulses 2/4 bilateral. CFT <3 seconds. Normal hair growth on digits. No edema.  Skin. No lacerations or abrasions bilateral feet.  Musculoskeletal: MMT 5/5 bilateral lower extremities in DF, PF, Inversion and Eversion. Deceased ROM in DF of ankle joint.  Some pain over third metatarsal today.  No pain with ROM of the lesser digits and no pain plantarly.  Neurological: Sensation intact to light touch.   Assessment:   1. Stress reaction of left foot, initial encounter      Plan:  Patient was evaluated and treated and all questions answered. X-rays reviewed and discussed with patient. No acute fractures or dislocations noted. Mild pes planus and collapse of the medial arch noted. Third metatarsal neck with healing fracture and callus noted.  -Discussed treatement options for stress reaction; risks, alternatives, and benefits explained.  -May discontinue CAM boot with WBAT in regular shoes and return slowly into normal acitivty.  -Recommend protection, rest, ice, elevation daily until symptoms improve -Ant-inflammatories as needed -Patient to return to office as needed   Louann Sjogren, DPM

## 2023-09-28 ENCOUNTER — Other Ambulatory Visit: Payer: Self-pay

## 2023-09-28 ENCOUNTER — Ambulatory Visit (INDEPENDENT_AMBULATORY_CARE_PROVIDER_SITE_OTHER): Payer: BC Managed Care – PPO | Admitting: Allergy & Immunology

## 2023-09-28 ENCOUNTER — Encounter: Payer: Self-pay | Admitting: Allergy & Immunology

## 2023-09-28 VITALS — BP 118/72 | HR 76 | Temp 98.5°F | Resp 18

## 2023-09-28 DIAGNOSIS — J3089 Other allergic rhinitis: Secondary | ICD-10-CM

## 2023-09-28 DIAGNOSIS — T7807XD Anaphylactic reaction due to milk and dairy products, subsequent encounter: Secondary | ICD-10-CM

## 2023-09-28 DIAGNOSIS — J4540 Moderate persistent asthma, uncomplicated: Secondary | ICD-10-CM | POA: Diagnosis not present

## 2023-09-28 DIAGNOSIS — T78079D Anaphylactic reaction due to milk and dairy products, unspecified, subsequent encounter: Secondary | ICD-10-CM

## 2023-09-28 MED ORDER — LEVOCETIRIZINE DIHYDROCHLORIDE 5 MG PO TABS: 5.0000 mg | ORAL_TABLET | Freq: Every evening | ORAL | 1 refills | Status: DC

## 2023-09-28 MED ORDER — EPINEPHRINE 0.3 MG/0.3ML IJ SOAJ: 0.3000 mg | Freq: Once | INTRAMUSCULAR | 2 refills | Status: DC

## 2023-09-28 MED ORDER — PREDNISONE 10 MG PO TABS: ORAL_TABLET | ORAL | 0 refills | Status: DC

## 2023-09-28 NOTE — Patient Instructions (Addendum)
1. Moderate persistent asthma  - Lung function looks stable today - We are not going to make any medication changes today.  - Daily controller medication(s): Breo 200 one puff once daily - Prior to physical activity: albuterol 2 puffs 10-15 minutes before physical activity. - Rescue medications: albuterol 4 puffs every 4-6 hours as needed - Asthma control goals:  * Full participation in all desired activities (may need albuterol before activity) * Albuterol use two time or less a week on average (not counting use with activity) * Cough interfering with sleep two time or less a month * Oral steroids no more than once a year * No hospitalizations  2. Adverse food reaction (cow's milk)  - Continue to avoid cow's milk. - EpiPen renewed today today.   3. Perennial allergic rhinitis (horse, mouse, dust mites, cat and dog) - We are getting repeat allergy testing via the blood.  - Continue with Xyzal 5mg  daily. - Sample of Ryaltris provided today.  - Prednisone burst provided today. - Consider allergy shots for long term control.   4. Return in about 6 months (around 03/27/2024). You can have the follow up appointment with Dr. Dellis Anes or a Nurse Practicioner (our Nurse Practitioners are excellent and always have Physician oversight!).    Please inform us of any Emergency Department visits, hospitalizations, or changes in symptoms. Call us before going to the ED for breathing or allergy symptoms since we might be able to fit you in for a sick visit. Feel free to contact us anytime with any questions, problems, or concerns.  It was a pleasure to see you and your family again today!  Websites that have reliable patient information: 1. American Academy of Asthma, Allergy, and Immunology: www.aaaai.org 2. Food Allergy Research and Education (FARE): foodallergy.org 3. Mothers of Asthmatics: http://www.asthmacommunitynetwork.org 4. American College of Allergy, Asthma, and Immunology:  www.acaai.org      "Like" [place] on Facebook and Instagram for our latest updates!      A healthy democracy works best when [employer] participate! Make sure you are registered to vote! If you have moved or changed any of your contact information, you will need to get this updated before voting! Scan the QR codes below to learn more!         Allergy Shots  Allergies are the result of a chain reaction that starts in the immune system. Your immune system controls how your body defends itself. For instance, if you have an allergy to pollen, your immune system identifies pollen as an invader or allergen. Your immune system overreacts by producing antibodies called Immunoglobulin E (IgE). These antibodies travel to cells that release chemicals, causing an allergic reaction.  The concept behind allergy immunotherapy, whether it is received in the form of shots or tablets, is that the immune system can be desensitized to specific allergens that trigger allergy symptoms. Although it requires time and patience, the payback can be long-term relief. Allergy injections contain a dilute solution of those substances that you are allergic to based upon your skin testing and allergy history.   How Do Allergy Shots Work?  Allergy shots work much like a vaccine. Your body responds to injected amounts of a particular allergen given in increasing doses, eventually developing a resistance and tolerance to it. Allergy shots can lead to decreased, minimal or no allergy symptoms.  There generally are two phases: build-up and maintenance. Build-up often ranges from three to six months and involves receiving injections with increasing amounts of  the allergens. The shots are typically given once or twice a week, though more rapid build-up schedules are sometimes used.  The maintenance phase begins when the most effective dose is reached. This dose is different for each person, depending on how allergic you are and  your response to the build-up injections. Once the maintenance dose is reached, there are longer periods between injections, typically two to four weeks.  Occasionally doctors give cortisone-type shots that can temporarily reduce allergy symptoms. These types of shots are different and should not be confused with allergy immunotherapy shots.  Who Can Be Treated with Allergy Shots?  Allergy shots may be a good treatment approach for people with allergic rhinitis (hay fever), allergic asthma, conjunctivitis (eye allergy) or stinging insect allergy.   Before deciding to begin allergy shots, you should consider:   The length of allergy season and the severity of your symptoms  Whether medications and/or changes to your environment can control your symptoms  Your desire to avoid long-term medication use  Time: allergy immunotherapy requires a major time commitment  Cost: may vary depending on your insurance coverage  Allergy shots for children age 44 and older are effective and often well tolerated. They might prevent the onset of new allergen sensitivities or the progression to asthma.  Allergy shots are not started on patients who are pregnant but can be continued on patients who become pregnant while receiving them. In some patients with other medical conditions or who take certain common medications, allergy shots may be of risk. It is important to mention other medications you talk to your allergist.   What are the two types of build-ups offered:   RUSH or Rapid Desensitization -- one day of injections lasting from 8:30-4:30pm, injections every 1 hour.  Approximately half of the build-up process is completed in that one day.  The following week, normal build-up is resumed, and this entails ~16 visits either weekly or twice weekly, until reaching your "maintenance dose" which is continued weekly until eventually getting spaced out to every month for a duration of 3 to 5 years. The regular  build-up appointments are nurse visits where the injections are administered, followed by required monitoring for 30 minutes.    Traditional build-up -- weekly visits for 6 -12 months until reaching "maintenance dose", then continue weekly until eventually spacing out to every 4 weeks as above. At these appointments, the injections are administered, followed by required monitoring for 30 minutes.     Either way is acceptable, and both are equally effective. With the rush protocol, the advantage is that less time is spent here for injections overall AND you would also reach maintenance dosing faster (which is when the clinical benefit starts to become more apparent). Not everyone is a candidate for rapid desensitization.   IF we proceed with the RUSH protocol, there are premedications which must be taken the day before and the day after the rush only (this includes antihistamines, steroids, and Singulair).  After the rush day, no prednisone or Singulair is required, and we just recommend antihistamines taken on your injection day.  What Is An Estimate of the Costs?  If you are interested in starting allergy injections, please check with your insurance company about your coverage for both allergy vial sets and allergy injections.  Please do so prior to making the appointment to start injections.  The following are CPT codes to give to your insurance company. These are the amounts we BILL to [employer], but  the amount YOU WILL PAY and WE RECEIVE IS SUBSTANTIALLY LESS and depends on the contracts we have with different insurance companies.   Amount Billed to Insurance One allergy vial set  CPT 95165   $ 1200     Two allergy vial set  CPT 95165   $ 2400     Three allergy vial set  CPT 95165   $ 3600     One injection   CPT 95115   $ 35  Two injections   CPT 95117   $ 40 RUSH (Rapid Desensitization) CPT 95180 x 8 hours $500/hour  Regarding the allergy injections, your co-pay may or may not  apply with each injection, so please confirm this with your insurance company. When you start allergy injections, 1 or 2 sets of vials are made based on your allergies.  Not all patients can be on one set of vials. A set of vials lasts 6 months to a year depending on how quickly you can proceed with your build-up of your allergy injections. Vials are personalized for each patient depending on their specific allergens.  How often are allergy injection given during the build-up period?   Injections are given at least weekly during the build-up period until your maintenance dose is achieved. Per the doctor's discretion, you may have the option of getting allergy injections two times per week during the build-up period. However, there must be at least 48 hours between injections. The build-up period is usually completed within 6-12 months depending on your ability to schedule injections and for adjustments for reactions. When maintenance dose is reached, your injection schedule is gradually changed to every two weeks and later to every three weeks. Injections will then continue every 4 weeks. Usually, injections are continued for a total of 3-5 years.   When Will I Feel Better?  Some may experience decreased allergy symptoms during the build-up phase. For others, it may take as long as 12 months on the maintenance dose. If there is no improvement after a year of maintenance, your allergist will discuss other treatment options with you.  If you aren't responding to allergy shots, it may be because there is not enough dose of the allergen in your vaccine or there are missing allergens that were not identified during your allergy testing. Other reasons could be that there are high levels of the allergen in your environment or major exposure to non-allergic triggers like tobacco smoke.  What Is the Length of Treatment?  Once the maintenance dose is reached, allergy shots are generally continued for three to  five years. The decision to stop should be discussed with your allergist at that time. Some people may experience a permanent reduction of allergy symptoms. Others may relapse and a longer course of allergy shots can be considered.  What Are the Possible Reactions?  The two types of adverse reactions that can occur with allergy shots are local and systemic. Common local reactions include very mild redness and swelling at the injection site, which can happen immediately or several hours after. Report a delayed reaction from your last injection. These include arm swelling or runny nose, watery eyes or cough that occurs within 12-24 hours after injection. A systemic reaction, which is less common, affects the entire body or a particular body system. They are usually mild and typically respond quickly to medications. Signs include increased allergy symptoms such as sneezing, a stuffy nose or hives.   Rarely, a serious systemic reaction called anaphylaxis can  develop. Symptoms include swelling in the throat, wheezing, a feeling of tightness in the chest, nausea or dizziness. Most serious systemic reactions develop within 30 minutes of allergy shots. This is why it is strongly recommended you wait in your doctor's office for 30 minutes after your injections. Your allergist is trained to watch for reactions, and his or her staff is trained and equipped with the proper medications to identify and treat them.   Report to the nurse immediately if you experience any of the following symptoms: swelling, itching or redness of the skin, hives, watery eyes/nose, breathing difficulty, excessive sneezing, coughing, stomach pain, diarrhea, or light headedness. These symptoms may occur within 15-20 minutes after injection and may require medication.   Who Should Administer Allergy Shots?  The preferred location for receiving shots is your prescribing allergist's office. Injections can sometimes be given at another facility  where the physician and staff are trained to recognize and treat reactions, and have received instructions by your prescribing allergist.  What if I am late for an injection?   Injection dose will be adjusted depending upon how many days or weeks you are late for your injection.   What if I am sick?   Please report any illness to the nurse before receiving injections. She may adjust your dose or postpone injections depending on your symptoms. If you have fever, flu, sinus infection or chest congestion it is best to postpone allergy injections until you are better. Never get an allergy injection if your asthma is causing you problems. If your symptoms persist, seek out medical care to get your health problem under control.  What If I am or Become Pregnant:  Women that become pregnant should schedule an appointment with [facility] before receiving any further allergy injections.

## 2023-09-28 NOTE — Progress Notes (Signed)
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 Moderate persistent asthma  - Lung function looks stable today - We are not going to make any medication changes today.  - Daily controller medication(s): Breo 200 one puff once daily - Prior to physical activity: albuterol 2 puffs 10-15 minutes before physical activity. - Rescue medications: albuterol 4 puffs every 4-6 hours as needed - Asthma control goals:  * Full participation in all desired activities (may need albuterol before activity) * Albuterol use two time or less a week on average (not counting use with activity) * Cough interfering with sleep two time or less a month * Oral steroids no more than once a year * No hospitalizations  2. Adverse food reaction (cow's milk)  - Continue to avoid cow's milk. - EpiPen renewed today today.   3. Perennial allergic rhinitis (horse, mouse, dust mites, cat and dog) - We are getting repeat allergy testing via the blood.  - Continue with Xyzal 5mg  daily. - Sample of Ryaltris provided today.  - Prednisone burst provided today. - Consider allergy shots for long term control.   4. Return in about 6 months (around 03/27/2024). You can have the follow up appointment with Dr. Dellis Anes or a Nurse Practicioner (our Nurse Practitioners are excellent and always have Physician oversight!).    Subjective:   Cindy Hansen is a 42 y.o. 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  Discussed the use of AI scribe software for clinical note transcription with the patient and/or guardian, who gave verbal consent to proceed.  Cindy Hansen is a 42 y.o. female presenting for a follow up visit.  She was last seen in May 2024.  At that time, lung function looked normal.  We continue with Breo 200 mcg 1 puff once daily as well as albuterol as needed.  For her cows milk allergy, she continue to avoid this.  Epinephrine was up-to-date.  For her allergic rhinitis, we continued with Xyzal and started Ryaltris and started her on a prednisone burst.  We talked about allergy shots for long-term control.  Since last visit, she has done fairly well.  Asthma/Respiratory Symptom History: Cindy Hansen remains on Breo. They report no issues with this medication.  She does report that it is affordable for her.  She estimates maybe $25-$30 per month.  She has not been using her rescue inhaler at all.  She has not been on prednisone nor is she been to the emergency room for her symptoms.  She is not having any nighttime coughing or wheezing.  Allergic Rhinitis Symptom History: Cindy Hansen presents with worsening allergies, which they attribute to  fluctuating weather conditions and exposure to strong scents. They report using levocetirizine (Xyzal) daily for symptom management, but it appears to be insufficient in controlling their symptoms. They describe their symptoms as an "allergy attack".  This is in general a fairly that time of the year for her symptoms.  Alternating whether has been an issue.  We have discussed allergy shots in the past, but  she is not sure she wants to pursue this right now.  The patient has been prescribed prednisone in the past, which they report as being effective. They estimate needing it approximately every six months for her environmental allergies. They were previously prescribed Ryaltris nasal spray, but it appears they did not find it beneficial or cost-effective.  She did try it, but did not feel like she needed to purchase it because she did not like the sensation anyway.  Food Allergy Symptom History: In addition to their allergies, the patient has a known milk allergy and has been avoiding milk in all forms, with the exception of some baked goods. They report an unpleasant taste when consuming milk-containing products.  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.  She still likes her job quite a bit.  Otherwise, there have been no changes to her past medical history, surgical history, family history, or social history.    Review of systems otherwise negative other than that mentioned in the HPI.    Objective:   Blood pressure 118/72, pulse 76, temperature 98.5 F (36.9 C), temperature source Temporal, resp. rate 18, SpO2 98%, currently breastfeeding. There is no height or weight on file to calculate BMI.    Physical Exam Vitals reviewed.  Constitutional:      Appearance: She is well-developed.     Comments: Smiling and delightful.  HENT:     Head: Normocephalic and atraumatic.     Right Ear: Tympanic membrane, ear canal and external ear normal.     Left Ear: Tympanic membrane, ear canal and external ear normal.     Nose: No nasal deformity, septal deviation, mucosal edema or rhinorrhea.     Right Turbinates: Enlarged, swollen and pale.     Left Turbinates: Enlarged, swollen and pale.     Right Sinus: No maxillary sinus tenderness or frontal sinus tenderness.     Left Sinus: No maxillary sinus tenderness or frontal sinus tenderness.     Comments: No  nasal polyps. Moving air well in all lung fields. No increased work of breathing noted.     Mouth/Throat:     Lips: Pink.     Mouth: Mucous membranes are moist. Mucous membranes are not pale and not dry.     Pharynx: Uvula midline.     Comments: Minimal cobblestoning.  Eyes:     General: Lids are normal. Allergic shiner present.        Right eye: No discharge.        Left eye: No discharge.     Conjunctiva/sclera: Conjunctivae normal.     Right eye: Right conjunctiva is not injected. No chemosis.    Left eye: Left conjunctiva is not injected. No chemosis.    Pupils: Pupils are equal, round, and reactive to light.  Cardiovascular:     Rate and Rhythm: Normal rate and regular rhythm.     Heart sounds: Normal heart sounds.  Pulmonary:     Effort: Pulmonary effort is normal. No tachypnea, accessory muscle usage or respiratory distress.     Breath sounds:  Normal breath sounds. No wheezing, rhonchi or rales.     Comments: Moving air well in all lung fields. Chest:     Chest wall: No tenderness.  Lymphadenopathy:     Cervical: No cervical adenopathy.  Skin:    Coloration: Skin is not pale.     Findings: No abrasion, erythema, petechiae or rash. Rash is not papular, urticarial or vesicular.  Neurological:     Mental Status: She is alert.  Psychiatric:        Behavior: Behavior is cooperative.      Diagnostic studies:    Spirometry: results normal (FEV1: 1.95/74%, FVC: 2.68/83%, FEV1/FVC: 73%).    Spirometry consistent with normal pattern.    Allergy Studies: none       Malachi Bonds, MD  [facility]

## 2023-09-29 ENCOUNTER — Encounter: Payer: Self-pay | Admitting: Allergy & Immunology

## 2023-09-29 MED ORDER — EPINEPHRINE 0.3 MG/0.3ML IJ SOAJ: 0.3000 mg | Freq: Once | INTRAMUSCULAR | 2 refills | Status: AC

## 2023-10-03 LAB — ALLERGEN PROFILE, MOLD
Aureobasidi Pullulans IgE: <0.1 kU/L
Candida Albicans IgE: <0.1 kU/L
M009-IgE Fusarium proliferatum: <0.1 kU/L
M014-IgE Epicoccum purpur: <0.1 kU/L
Mucor Racemosus IgE: <0.1 kU/L
Phoma Betae IgE: <0.1 kU/L
Setomelanomma Rostrat: <0.1 kU/L
Stemphylium Herbarum IgE: <0.1 kU/L

## 2023-10-03 LAB — PANEL 606578
E094-IgE Fel d 1: <0.1 kU/L
E220-IgE Fel d 2: 3.9 kU/L — AB
E228-IgE Fel d 4: <0.1 kU/L

## 2023-10-03 LAB — ALLERGENS W/COMP RFLX AREA 2
Alternaria Alternata IgE: <0.1 kU/L
Aspergillus Fumigatus IgE: <0.1 kU/L
Bermuda Grass IgE: <0.1 kU/L
Cedar, Mountain IgE: <0.1 kU/L
Cladosporium Herbarum IgE: <0.1 kU/L
Cockroach, German IgE: <0.1 kU/L
Common Silver Birch IgE: <0.1 kU/L
Cottonwood IgE: <0.1 kU/L
D Farinae IgE: <0.1 kU/L
D Pteronyssinus IgE: <0.1 kU/L
E001-IgE Cat Dander: 0.38 kU/L — AB
E005-IgE Dog Dander: 7.48 kU/L — AB
Elm, American IgE: <0.1 kU/L
IgE (Immunoglobulin E), Serum: 43 [IU]/mL (ref 6–495)
Johnson Grass IgE: <0.1 kU/L
Maple/Box Elder IgE: <0.1 kU/L
Mouse Urine IgE: 0.23 kU/L — AB
Oak, White IgE: <0.1 kU/L
Pecan, Hickory IgE: <0.1 kU/L
Penicillium Chrysogen IgE: <0.1 kU/L
Pigweed, Rough IgE: <0.1 kU/L
Ragweed, Short IgE: <0.1 kU/L
Sheep Sorrel IgE Qn: <0.1 kU/L
Timothy Grass IgE: <0.1 kU/L
White Mulberry IgE: <0.1 kU/L

## 2023-10-03 LAB — PANEL 606648
E101-IgE Can f 1: 2.34 kU/L — AB
E102-IgE Can f 2: <0.1 kU/L
E221-IgE Can f 3: 12.5 kU/L — AB
E226-IgE Can f 5: <0.1 kU/L

## 2023-10-03 LAB — ALLERGEN COMPONENT COMMENTS

## 2023-10-25 ENCOUNTER — Other Ambulatory Visit: Payer: Self-pay | Admitting: Allergy & Immunology

## 2024-02-20 ENCOUNTER — Other Ambulatory Visit: Payer: Self-pay | Admitting: Allergy & Immunology

## 2024-03-28 ENCOUNTER — Ambulatory Visit (INDEPENDENT_AMBULATORY_CARE_PROVIDER_SITE_OTHER): Payer: BC Managed Care – PPO | Admitting: Allergy & Immunology

## 2024-03-28 VITALS — BP 106/64 | HR 98 | Temp 98.1°F | Resp 18 | Ht 64.0 in | Wt 233.2 lb

## 2024-03-28 DIAGNOSIS — J4540 Moderate persistent asthma, uncomplicated: Secondary | ICD-10-CM | POA: Diagnosis not present

## 2024-03-28 DIAGNOSIS — J3089 Other allergic rhinitis: Secondary | ICD-10-CM | POA: Diagnosis not present

## 2024-03-28 DIAGNOSIS — T7807XD Anaphylactic reaction due to milk and dairy products, subsequent encounter: Secondary | ICD-10-CM

## 2024-03-28 MED ORDER — RYALTRIS 665-25 MCG/ACT NA SUSP: 2.0000 | Freq: Two times a day (BID) | NASAL | 1 refills | Status: AC

## 2024-03-28 MED ORDER — EPINEPHRINE 0.3 MG/0.3ML IJ SOAJ: 0.3000 mg | INTRAMUSCULAR | 1 refills | Status: AC | PRN

## 2024-03-28 MED ORDER — ALBUTEROL SULFATE HFA 108 (90 BASE) MCG/ACT IN AERS
2.0000 | INHALATION_SPRAY | RESPIRATORY_TRACT | 1 refills | Status: AC | PRN
Start: 2024-03-28 — End: ?

## 2024-03-28 MED ORDER — LEVOCETIRIZINE DIHYDROCHLORIDE 5 MG PO TABS: 5.0000 mg | ORAL_TABLET | Freq: Every day | ORAL | 1 refills | Status: AC | PRN

## 2024-03-28 MED ORDER — BREO ELLIPTA 200-25 MCG/ACT IN AEPB: 1.0000 | INHALATION_SPRAY | Freq: Every morning | RESPIRATORY_TRACT | 1 refills | Status: AC

## 2024-03-28 NOTE — Patient Instructions (Addendum)
 1. Moderate persistent asthma  - Lung function looks stable today - We are not going to make any medication changes today.  - Daily controller medication(s): Breo 200 one puff once daily - Prior to physical activity: albuterol  2 puffs 10-15 minutes before physical activity. - Rescue medications: albuterol  4 puffs every 4-6 hours as needed - Asthma control goals:  * Full participation in all desired activities (may need albuterol  before activity) * Albuterol  use two time or less a week on average (not counting use with activity) * Cough interfering with sleep two time or less a month * Oral steroids no more than once a year * No hospitalizations  2. Adverse food reaction (cow's milk)  - Continue to avoid cow's milk. - EpiPen  renewed today today.   3. Perennial allergic rhinitis (horse, mouse, dust mites, cat and dog) - We can do more sensitive intradermal testing if your symptoms do well.  - Continue with Xyzal  5mg  daily. - Continue with Ryaltris  provided today.   4. Return in about 6 months (around 09/28/2024). You can have the follow up appointment with Dr. Idolina Maker or a Nurse Practicioner (our Nurse Practitioners are excellent and always have Physician oversight!).    Please inform us  of any Emergency Department visits, hospitalizations, or changes in symptoms. Call us  before going to the ED for breathing or allergy symptoms since we might be able to fit you in for a sick visit. Feel free to contact us  anytime with any questions, problems, or concerns.  It was a pleasure to see you and your family again today!  Websites that have reliable patient information: 1. American Academy of Asthma, Allergy, and Immunology: www.aaaai.org 2. Food Allergy Research and Education (FARE): foodallergy.org 3. Mothers of Asthmatics: http://www.asthmacommunitynetwork.org 4. American College of Allergy, Asthma, and Immunology: www.acaai.org      "Like" us  on Facebook and Instagram for our  latest updates!      A healthy democracy works best when [employer] participate! Make sure you are registered to vote! If you have moved or changed any of your contact information, you will need to get this updated before voting! Scan the QR codes below to learn more!

## 2024-03-28 NOTE — Progress Notes (Signed)
 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 Moderate persistent asthma  - Lung function looks stable today - We are not going to make any medication changes today.  - Daily controller medication(s): Breo 200 one puff once daily - Prior to physical activity: albuterol  2 puffs 10-15 minutes before physical activity. - Rescue medications: albuterol  4 puffs every 4-6 hours as needed - Asthma control goals:  * Full participation in all desired activities (may need albuterol  before activity) * Albuterol  use two time or less a week on average (not counting use with activity) * Cough interfering with sleep two time or less a month * Oral steroids no more than once a year * No hospitalizations  2. Adverse food reaction (cow's milk)  - Continue to avoid cow's milk. - EpiPen  renewed today today.   3. Perennial allergic rhinitis (horse, mouse, dust mites, cat and dog) - We can do more sensitive intradermal testing if your symptoms do well.  - Continue with Xyzal  5mg  daily. - Continue with Ryaltris  provided today.   4. Return in about 6 months (around 09/28/2024). You can have the follow up appointment with Dr. Idolina Maker or a Nurse Practicioner (our Nurse Practitioners are excellent and always have Physician oversight!).   Subjective:   Cindy Hansen is a 43 y.o. female presenting today for follow up of  Chief Complaint  Patient presents with   Asthma    Says she is okay.     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  Discussed the use of AI scribe software for clinical note transcription with the patient and/or guardian, who gave verbal consent to proceed.  Cindy Hansen is a 43 y.o. female presenting for a follow up visit.  She was last seen in November 2024.  At that time, lung function looks stable.  We continue with Breo 200 mcg 1 puff once daily and albuterol  as needed.  For her allergic rhinitis, we obtained repeat allergy testing via the blood.  We continue with Xyzal  and provided a sample of Ryaltris .  Her allergy testing was positive for cat, dog, and mouse.  Since the last visit, she has done relatively well.  Asthma/Respiratory Symptom History: She uses Breo for asthma management, which she finds necessary despite its cost, as it significantly improves her condition. Vestal's asthma has been well controlled. She has not required rescue medication, experienced nocturnal awakenings due to lower respiratory symptoms, nor have activities of daily living been limited. She has required no Emergency Department or Urgent Care visits for her asthma. She has required zero courses of systemic steroids for asthma exacerbations since the last visit. ACT score today is 25, indicating excellent asthma symptom control.   Allergic Rhinitis Symptom History: She experiences significant nasal and sinus pressure due  to allergies, which is alleviated by a nasal spray, possibly Ryaltris . Initially hesitant to use it, she now finds it effective, although she does not use it regularly enough to remember its name. She knows that she should likely use it more consistently, as she feels better when she does that.   Her  daughter has been exposed to dogs, which she loves, but has not shown any allergic reactions to them so far.   Food Allergy Symptom History: She continues to avoid cow's milk. She does need a new EpiPen  prescription.   She recently started using Invisalign as a personal gift to herself, having had braces as a child but not maintaining retainer use. She is satisfied with the Invisalign.  Otherwise, there have been no changes to her past medical history, surgical history, family history, or social history.    Review of systems otherwise negative other than that mentioned in the HPI.    Objective:   Blood pressure 106/64, pulse 98, temperature 98.1 F (36.7 C), temperature source Temporal, resp. rate 18, height 5\' 4"  (1.626 m), weight 233 lb 3.2 oz (105.8 kg), SpO2 97%, currently breastfeeding. Body mass index is 40.03 kg/m.    Physical Exam Vitals reviewed.  Constitutional:      Appearance: She is well-developed.     Comments: Smiling and delightful.  HENT:     Head: Normocephalic and atraumatic.     Right Ear: Tympanic membrane, ear canal and external ear normal.     Left Ear: Tympanic membrane, ear canal and external ear normal.     Nose: No nasal deformity, septal deviation, mucosal edema or rhinorrhea.     Right Turbinates: Enlarged, swollen and pale.     Left Turbinates: Enlarged, swollen and pale.     Right Sinus: No maxillary sinus tenderness or frontal sinus tenderness.     Left Sinus: No maxillary sinus tenderness or frontal sinus tenderness.     Comments: No nasal polyps. Moving air well in all lung fields. No increased work of breathing noted.     Mouth/Throat:     Lips: Pink.     Mouth: Mucous membranes are moist. Mucous membranes are not pale and not dry.     Pharynx: Uvula midline.     Comments: Minimal cobblestoning.  Eyes:     General: Lids are normal. Allergic shiner present.        Right eye: No discharge.        Left eye: No discharge.      Conjunctiva/sclera: Conjunctivae normal.     Right eye: Right conjunctiva is not injected. No chemosis.    Left eye: Left conjunctiva is not injected. No chemosis.    Pupils: Pupils are equal, round, and reactive to light.  Cardiovascular:     Rate and Rhythm: Normal rate and regular rhythm.     Heart sounds: Normal heart sounds.  Pulmonary:     Effort: Pulmonary effort is normal. No tachypnea, accessory muscle usage or respiratory distress.     Breath sounds: Normal breath sounds. No wheezing, rhonchi or rales.     Comments: Moving air well in all lung fields. Chest:     Chest wall: No tenderness.  Lymphadenopathy:     Cervical: No cervical adenopathy.  Skin:    Coloration: Skin is not pale.     Findings: No abrasion, erythema, petechiae or rash. Rash is not papular, urticarial or vesicular.  Neurological:     Mental Status: She is alert.  Psychiatric:  Behavior: Behavior is cooperative.      Diagnostic studies:    Spirometry: results normal (FEV1: 2.00/79%, FVC: 2.90/94%, FEV1/FVC: 69%).    Spirometry consistent with normal pattern.   Allergy Studies: none       Drexel Gentles, MD  [facility]  

## 2024-03-29 ENCOUNTER — Encounter: Payer: Self-pay | Admitting: Allergy & Immunology

## 2024-07-10 ENCOUNTER — Other Ambulatory Visit: Payer: Self-pay | Admitting: Obstetrics and Gynecology

## 2024-07-10 DIAGNOSIS — Z1231 Encounter for screening mammogram for malignant neoplasm of breast: Secondary | ICD-10-CM

## 2024-07-11 ENCOUNTER — Ambulatory Visit
Admission: RE | Admit: 2024-07-11 | Discharge: 2024-07-11 | Disposition: A | Discharge status: Home / Self Care | Payer: Self-pay | Source: Ambulatory Visit | Attending: Obstetrics and Gynecology | Admitting: Obstetrics and Gynecology

## 2024-07-11 DIAGNOSIS — Z1231 Encounter for screening mammogram for malignant neoplasm of breast: Secondary | ICD-10-CM

## 2024-09-16 ENCOUNTER — Ambulatory Visit: Admitting: Internal Medicine

## 2024-09-29 ENCOUNTER — Other Ambulatory Visit: Payer: Self-pay | Admitting: Allergy & Immunology
